# Patient Record
Sex: Female | Born: 1950 | Race: White | Hispanic: No | Smoking: Current every day smoker
Health system: Southern US, Community
[De-identification: ages and names within clinical notes are randomized; demographics above are authoritative.]

## PROBLEM LIST (undated history)

## (undated) DIAGNOSIS — E7800 Pure hypercholesterolemia, unspecified: Secondary | ICD-10-CM

## (undated) DIAGNOSIS — C439 Malignant melanoma of skin, unspecified: Secondary | ICD-10-CM

## (undated) DIAGNOSIS — I10 Essential (primary) hypertension: Secondary | ICD-10-CM

## (undated) DIAGNOSIS — E119 Type 2 diabetes mellitus without complications: Secondary | ICD-10-CM

## (undated) HISTORY — PX: APPENDECTOMY: SHX54

## (undated) HISTORY — PX: SKIN SURGERY: SHX2413

## (undated) HISTORY — PX: BREAST EXCISIONAL BIOPSY: SUR124

## (undated) HISTORY — PX: TUBAL LIGATION: SHX77

## (undated) HISTORY — DX: Essential (primary) hypertension: I10

## (undated) HISTORY — DX: Malignant melanoma of skin, unspecified: C43.9

## (undated) HISTORY — PX: BREAST CYST EXCISION: SHX579

---

## 2003-04-25 DIAGNOSIS — C439 Malignant melanoma of skin, unspecified: Secondary | ICD-10-CM

## 2003-04-25 HISTORY — DX: Malignant melanoma of skin, unspecified: C43.9

## 2003-10-08 ENCOUNTER — Other Ambulatory Visit: Payer: Self-pay

## 2003-10-10 ENCOUNTER — Other Ambulatory Visit: Payer: Self-pay

## 2005-05-23 ENCOUNTER — Ambulatory Visit: Payer: Self-pay

## 2005-06-02 ENCOUNTER — Ambulatory Visit: Payer: Self-pay

## 2005-06-08 ENCOUNTER — Ambulatory Visit: Payer: Self-pay | Admitting: Family Medicine

## 2005-12-15 ENCOUNTER — Ambulatory Visit: Payer: Self-pay

## 2006-08-16 ENCOUNTER — Ambulatory Visit: Payer: Self-pay

## 2006-08-23 ENCOUNTER — Ambulatory Visit: Payer: Self-pay

## 2009-01-28 ENCOUNTER — Emergency Department: Payer: Self-pay | Admitting: Emergency Medicine

## 2009-09-21 ENCOUNTER — Ambulatory Visit: Payer: Self-pay | Admitting: Family Medicine

## 2009-09-23 ENCOUNTER — Ambulatory Visit: Payer: Self-pay | Admitting: Family Medicine

## 2009-09-28 ENCOUNTER — Ambulatory Visit: Payer: Self-pay

## 2009-11-27 ENCOUNTER — Emergency Department: Payer: Self-pay | Admitting: Emergency Medicine

## 2010-03-09 ENCOUNTER — Ambulatory Visit: Payer: Self-pay

## 2010-04-12 LAB — PATHOLOGY REPORT

## 2010-06-07 ENCOUNTER — Emergency Department: Payer: Self-pay | Admitting: Emergency Medicine

## 2010-09-06 ENCOUNTER — Ambulatory Visit: Payer: Self-pay

## 2010-09-21 ENCOUNTER — Ambulatory Visit: Payer: Self-pay

## 2011-04-08 ENCOUNTER — Emergency Department: Payer: Self-pay | Admitting: Unknown Physician Specialty

## 2012-01-24 ENCOUNTER — Ambulatory Visit: Payer: Self-pay | Admitting: Family Medicine

## 2013-02-13 ENCOUNTER — Ambulatory Visit (INDEPENDENT_AMBULATORY_CARE_PROVIDER_SITE_OTHER): Payer: BC Managed Care – PPO | Admitting: Cardiovascular Disease

## 2013-02-13 ENCOUNTER — Encounter: Payer: Self-pay | Admitting: Cardiovascular Disease

## 2013-02-13 VITALS — BP 139/92 | HR 72 | Ht 64.0 in | Wt 185.8 lb

## 2013-02-13 DIAGNOSIS — E785 Hyperlipidemia, unspecified: Secondary | ICD-10-CM

## 2013-02-13 DIAGNOSIS — R519 Headache, unspecified: Secondary | ICD-10-CM

## 2013-02-13 DIAGNOSIS — R51 Headache: Secondary | ICD-10-CM

## 2013-02-13 DIAGNOSIS — R079 Chest pain, unspecified: Secondary | ICD-10-CM

## 2013-02-13 DIAGNOSIS — R0602 Shortness of breath: Secondary | ICD-10-CM

## 2013-02-13 DIAGNOSIS — I10 Essential (primary) hypertension: Secondary | ICD-10-CM

## 2013-02-13 DIAGNOSIS — R9431 Abnormal electrocardiogram [ECG] [EKG]: Secondary | ICD-10-CM

## 2013-02-13 NOTE — Assessment & Plan Note (Signed)
She is very concerned about her blood pressure. Repeat blood pressure by myself was 140/90. We have suggested she closely monitor her blood pressure at home over the next several weeks. If this continues to run high, she could take 1-1/2 of her current medication, possibly even doubling the dose. We have asked her her high salt meals. Recommended regular exercise and weight loss. Other medication options include amlodipine

## 2013-02-13 NOTE — Assessment & Plan Note (Signed)
She is requested to have her lab work done today. We will forward this to her primary care physician. Given her very strong family history, we have recommended continued smoking cessation, aggressive cholesterol control.

## 2013-02-13 NOTE — Progress Notes (Signed)
   Patient ID: Dawn Shepherd, female    DOB: Feb 12, 1951, 62 y.o.   MRN: [id number]  HPI Comments: Dawn Shepherd is a pleasant 62 year old woman with hypertension, obesity presents for abnormal EKG. He is a patient of Dr.Pancaldo.  She reports a long smoking history from age 7 up to age 62, one pack per day. Both of her parents died of MI in their early 61s.  Her biggest complaint today is waxing waning headaches. She felt her headache was secondary to running out of her blood pressure pills. Despite being back on her medication, she is continued to have mild headache. She does report some stress at work, uses the computer. Shoulders and muscles in her neck feel tight.  With activity she denies any significant shortness of breath or chest pain. She is otherwise very active with no complaints. Is concerned about abnormal EKG.  EKG done on 02/12/2013 with Dr. Lahoma Rocker shows normal sinus rhythm with rate 77 beats per minute with nonspecific T wave abnormality in leads V1 through V4 EKG today shows normal sinus rhythm with rate 72 beats a minute with no significant ST or T-wave abnormality. (Anterior T-wave abnormality has resolved)   Outpatient Encounter Prescriptions as of 02/13/2013  Medication Sig Dispense Refill  . benazepril-hydrochlorthiazide (LOTENSIN HCT) 10-12.5 MG per tablet Take 1 tablet by mouth daily.      . vitamin E 400 UNIT capsule Take 400 Units by mouth daily.       No facility-administered encounter medications on file as of 02/13/2013.     Review of Systems  Constitutional: Negative.   Eyes: Negative.   Respiratory: Negative.   Cardiovascular: Negative.   Gastrointestinal: Negative.   Endocrine: Negative.   Musculoskeletal: Negative.   Skin: Negative.   Allergic/Immunologic: Negative.   Neurological: Positive for headaches.  Hematological: Negative.   Psychiatric/Behavioral: Negative.   All other systems reviewed and are negative.    BP 139/92  Pulse 72  Ht 5\' 4"   (1.626 m)  Wt 185 lb 12 oz (84.256 kg)  BMI 31.87 kg/m2  Physical Exam  Nursing note and vitals reviewed. Constitutional: She is oriented to person, place, and time. She appears well-developed and well-nourished.  HENT:  Head: Normocephalic.  Nose: Nose normal.  Mouth/Throat: Oropharynx is clear and moist.  Eyes: Conjunctivae are normal. Pupils are equal, round, and reactive to light.  Neck: Normal range of motion. Neck supple. No JVD present.  Cardiovascular: Normal rate, regular rhythm, S1 normal, S2 normal, normal heart sounds and intact distal pulses.  Exam reveals no gallop and no friction rub.   No murmur heard. Pulmonary/Chest: Effort normal and breath sounds normal. No respiratory distress. She has no wheezes. She has no rales. She exhibits no tenderness.  Abdominal: Soft. Bowel sounds are normal. She exhibits no distension. There is no tenderness.  Musculoskeletal: Normal range of motion. She exhibits no edema and no tenderness.  Lymphadenopathy:    She has no cervical adenopathy.  Neurological: She is alert and oriented to person, place, and time. Coordination normal.  Skin: Skin is warm and dry. No rash noted. No erythema.  Psychiatric: She has a normal mood and affect. Her behavior is normal. Judgment and thought content normal.    Assessment and Plan

## 2013-02-13 NOTE — Assessment & Plan Note (Signed)
I suspect she may be having stress-related headaches. Unable to exclude some DJD in her neck. Recommended a cervical pillow. She is taking Aleve as needed.

## 2013-02-13 NOTE — Patient Instructions (Addendum)
You are doing well. No medication changes were made.  We will check you blood today Please monitor you blood pressure at home If your blood pressures runs high, call the office You could take 1 1/2  up to 2 blood pressure pills  Please call us if you have new issues that need to be addressed before your next appt.

## 2013-02-13 NOTE — Assessment & Plan Note (Signed)
She denies any significant symptoms of shortness of breath or chest pain. She does have headaches which are likely unrelated to a cardiac condition. Repeat EKG today shows normalization of her T waves. This could be secondary to lead placement. Given her good activity level with no symptoms, no further testing has been ordered at this time.

## 2013-02-14 LAB — LIPID PANEL
Chol/HDL Ratio: 9.2 ratio units — ABNORMAL HIGH (ref 0.0–4.4)
HDL: 34 mg/dL — ABNORMAL LOW (ref >39)
LDL Calculated: 209 mg/dL — ABNORMAL HIGH (ref 0–99)
Triglycerides: 357 mg/dL — ABNORMAL HIGH (ref 0–149)

## 2013-02-14 LAB — BASIC METABOLIC PANEL
BUN/Creatinine Ratio: 13 (ref 11–26)
BUN: 10 mg/dL (ref 8–27)
CO2: 25 mmol/L (ref 18–29)
Calcium: 10.3 mg/dL — ABNORMAL HIGH (ref 8.6–10.2)
Creatinine, Ser: 0.79 mg/dL (ref 0.57–1.00)
Glucose: 291 mg/dL — ABNORMAL HIGH (ref 65–99)
Sodium: 137 mmol/L (ref 134–144)

## 2013-02-27 ENCOUNTER — Telehealth: Payer: Self-pay

## 2013-02-27 DIAGNOSIS — E7800 Pure hypercholesterolemia, unspecified: Secondary | ICD-10-CM

## 2013-02-27 NOTE — Telephone Encounter (Signed)
Spoke w/ pt.  She would like to try lovastatin, if possible, b/c it is on the $4 list at [facility]. Please advise.  Thank you!

## 2013-02-27 NOTE — Telephone Encounter (Signed)
Message copied by Marilynne Halsted on Thu Feb 27, 2013  9:19 AM ------      Message from: Antonieta Iba      Created: Wed Feb 26, 2013 11:00 PM       Cholesterol is very elevated.      Would start a cholesterol medication such as atorvastatin 20 mg pill      Cut in 1/2 for the first few weeks, then full pill ------

## 2013-02-28 NOTE — Telephone Encounter (Signed)
This pill is weaker and need a higher dose Very possible that it may not work well enough that we can try Would order 40 mg pill, cutting half for the first few weeks then increase to a full pill, Repeat lipid panel in 3 months

## 2013-03-03 MED ORDER — LOVASTATIN 40 MG PO TABS: 40.0000 mg | ORAL_TABLET | Freq: Every day | ORAL | Status: DC

## 2013-03-03 NOTE — Telephone Encounter (Signed)
Spoke w/ pt.  She verbalizes understanding and is agreeable to plan. 

## 2013-03-28 ENCOUNTER — Ambulatory Visit: Payer: Self-pay | Admitting: Family Medicine

## 2013-04-11 ENCOUNTER — Ambulatory Visit: Payer: Self-pay | Admitting: Family Medicine

## 2013-04-27 ENCOUNTER — Emergency Department: Payer: Self-pay | Admitting: Emergency Medicine

## 2013-04-27 LAB — URINALYSIS, COMPLETE
BILIRUBIN, UR: NEGATIVE
Bacteria: NONE SEEN
Glucose,UR: 50 mg/dL (ref 0–75)
Hyaline Cast: 3
Leukocyte Esterase: NEGATIVE
NITRITE: NEGATIVE
PH: 5 (ref 4.5–8.0)
Protein: NEGATIVE
Specific Gravity: 1.01 (ref 1.003–1.030)
Squamous Epithelial: 1

## 2013-04-27 LAB — COMPREHENSIVE METABOLIC PANEL
ALK PHOS: 120 U/L — AB
ALT: 59 U/L (ref 12–78)
AST: 22 U/L (ref 15–37)
Albumin: 3.4 g/dL (ref 3.4–5.0)
Anion Gap: 7 (ref 7–16)
BUN: 14 mg/dL (ref 7–18)
Bilirubin,Total: 0.3 mg/dL (ref 0.2–1.0)
CO2: 25 mmol/L (ref 21–32)
Calcium, Total: 8.9 mg/dL (ref 8.5–10.1)
Chloride: 100 mmol/L (ref 98–107)
Creatinine: 0.91 mg/dL (ref 0.60–1.30)
EGFR (African American): >60
EGFR (Non-African Amer.): >60
Glucose: 236 mg/dL — ABNORMAL HIGH (ref 65–99)
Osmolality: 273 (ref 275–301)
Potassium: 3.3 mmol/L — ABNORMAL LOW (ref 3.5–5.1)
Sodium: 132 mmol/L — ABNORMAL LOW (ref 136–145)
Total Protein: 7.2 g/dL (ref 6.4–8.2)

## 2013-04-27 LAB — CBC
HCT: 43.5 % (ref 35.0–47.0)
HGB: 15.2 g/dL (ref 12.0–16.0)
MCH: 29.9 pg (ref 26.0–34.0)
MCHC: 34.9 g/dL (ref 32.0–36.0)
MCV: 86 fL (ref 80–100)
Platelet: 249 10*3/uL (ref 150–440)
RBC: 5.07 10*6/uL (ref 3.80–5.20)
RDW: 12.3 % (ref 11.5–14.5)
WBC: 7.3 10*3/uL (ref 3.6–11.0)

## 2013-04-27 LAB — LIPASE, BLOOD: Lipase: 178 U/L (ref 73–393)

## 2013-08-11 ENCOUNTER — Ambulatory Visit: Payer: Self-pay | Admitting: General Surgery

## 2013-08-21 ENCOUNTER — Encounter: Payer: Self-pay | Admitting: *Deleted

## 2013-10-21 ENCOUNTER — Ambulatory Visit: Payer: Self-pay | Admitting: Family Medicine

## 2014-07-12 ENCOUNTER — Emergency Department: Payer: Self-pay | Admitting: Internal Medicine

## 2014-09-03 ENCOUNTER — Ambulatory Visit: Payer: 59 | Admitting: *Deleted

## 2015-02-19 ENCOUNTER — Other Ambulatory Visit: Payer: Self-pay | Admitting: Family Medicine

## 2015-02-19 DIAGNOSIS — Z1231 Encounter for screening mammogram for malignant neoplasm of breast: Secondary | ICD-10-CM

## 2015-02-22 ENCOUNTER — Other Ambulatory Visit: Payer: Self-pay | Admitting: Family Medicine

## 2015-02-22 DIAGNOSIS — Z1239 Encounter for other screening for malignant neoplasm of breast: Secondary | ICD-10-CM

## 2015-12-16 ENCOUNTER — Other Ambulatory Visit: Payer: Self-pay | Admitting: Family Medicine

## 2015-12-16 DIAGNOSIS — N6320 Unspecified lump in the left breast, unspecified quadrant: Secondary | ICD-10-CM

## 2016-01-07 ENCOUNTER — Ambulatory Visit: Payer: Self-pay | Attending: Family Medicine

## 2016-01-07 ENCOUNTER — Other Ambulatory Visit: Payer: Self-pay

## 2016-01-07 ENCOUNTER — Ambulatory Visit: Admission: RE | Admit: 2016-01-07 | Payer: Self-pay | Source: Ambulatory Visit

## 2017-03-12 ENCOUNTER — Other Ambulatory Visit: Payer: Self-pay | Admitting: Family Medicine

## 2017-03-12 DIAGNOSIS — N6320 Unspecified lump in the left breast, unspecified quadrant: Secondary | ICD-10-CM

## 2017-04-26 ENCOUNTER — Encounter: Payer: Self-pay | Admitting: Radiology

## 2017-04-26 ENCOUNTER — Ambulatory Visit
Admission: RE | Admit: 2017-04-26 | Discharge: 2017-04-26 | Disposition: A | Discharge status: Home / Self Care | Payer: Medicare HMO | Source: Ambulatory Visit | Attending: Family Medicine | Admitting: Family Medicine

## 2017-04-26 DIAGNOSIS — N6323 Unspecified lump in the left breast, lower outer quadrant: Secondary | ICD-10-CM | POA: Diagnosis not present

## 2017-04-26 DIAGNOSIS — N6320 Unspecified lump in the left breast, unspecified quadrant: Secondary | ICD-10-CM

## 2017-11-08 ENCOUNTER — Emergency Department (HOSPITAL_COMMUNITY)
Admission: EM | Admit: 2017-11-08 | Discharge: 2017-11-08 | Disposition: A | Discharge status: Home / Self Care | Payer: Medicare HMO | Attending: Emergency Medicine | Admitting: Emergency Medicine

## 2017-11-08 ENCOUNTER — Other Ambulatory Visit: Payer: Self-pay

## 2017-11-08 ENCOUNTER — Encounter (HOSPITAL_COMMUNITY): Payer: Self-pay | Admitting: Emergency Medicine

## 2017-11-08 ENCOUNTER — Emergency Department (HOSPITAL_COMMUNITY): Payer: Medicare HMO

## 2017-11-08 DIAGNOSIS — I10 Essential (primary) hypertension: Secondary | ICD-10-CM | POA: Diagnosis not present

## 2017-11-08 DIAGNOSIS — R5383 Other fatigue: Secondary | ICD-10-CM | POA: Insufficient documentation

## 2017-11-08 DIAGNOSIS — Z87891 Personal history of nicotine dependence: Secondary | ICD-10-CM | POA: Insufficient documentation

## 2017-11-08 DIAGNOSIS — R110 Nausea: Secondary | ICD-10-CM | POA: Diagnosis not present

## 2017-11-08 DIAGNOSIS — Z7982 Long term (current) use of aspirin: Secondary | ICD-10-CM | POA: Insufficient documentation

## 2017-11-08 DIAGNOSIS — Z85828 Personal history of other malignant neoplasm of skin: Secondary | ICD-10-CM | POA: Insufficient documentation

## 2017-11-08 DIAGNOSIS — Z7984 Long term (current) use of oral hypoglycemic drugs: Secondary | ICD-10-CM | POA: Insufficient documentation

## 2017-11-08 DIAGNOSIS — R072 Precordial pain: Secondary | ICD-10-CM

## 2017-11-08 DIAGNOSIS — Z79899 Other long term (current) drug therapy: Secondary | ICD-10-CM | POA: Diagnosis not present

## 2017-11-08 LAB — CBC
HCT: 43.7 % (ref 36.0–46.0)
Hemoglobin: 14.4 g/dL (ref 12.0–15.0)
MCH: 29.4 pg (ref 26.0–34.0)
MCHC: 33 g/dL (ref 30.0–36.0)
MCV: 89.4 fL (ref 78.0–100.0)
PLATELETS: 307 10*3/uL (ref 150–400)
RBC: 4.89 MIL/uL (ref 3.87–5.11)
RDW: 12.3 % (ref 11.5–15.5)
WBC: 7.4 10*3/uL (ref 4.0–10.5)

## 2017-11-08 LAB — BASIC METABOLIC PANEL
Anion gap: 12 (ref 5–15)
BUN: 13 mg/dL (ref 8–23)
CHLORIDE: 104 mmol/L (ref 98–111)
CO2: 22 mmol/L (ref 22–32)
CREATININE: 0.87 mg/dL (ref 0.44–1.00)
Calcium: 9.8 mg/dL (ref 8.9–10.3)
GFR calc non Af Amer: >60 mL/min (ref >60)
Glucose, Bld: 162 mg/dL — ABNORMAL HIGH (ref 70–99)
Potassium: 4.4 mmol/L (ref 3.5–5.1)
Sodium: 138 mmol/L (ref 135–145)

## 2017-11-08 LAB — I-STAT TROPONIN, ED
TROPONIN I, POC: 0 ng/mL (ref 0.00–0.08)
TROPONIN I, POC: 0 ng/mL (ref 0.00–0.08)

## 2017-11-08 LAB — CBG MONITORING, ED: Glucose-Capillary: 155 mg/dL — ABNORMAL HIGH (ref 70–99)

## 2017-11-08 NOTE — ED Triage Notes (Addendum)
Patient complains of left sided chest pressure and weakness that started last night. History of diabetes. Alert and oriented and in no apparent distress at this time

## 2017-11-08 NOTE — ED Provider Notes (Signed)
[facility] EMERGENCY DEPARTMENT Provider Note   CSN: [id number] Arrival date & time: 11/08/17  1149     History   Chief Complaint Chief Complaint  Patient presents with  . Chest Pain    HPI Dawn Shepherd is a 67 y.o. female.  Patient with history of hypertension, diabetes, high cholesterol, family history of coronary artery disease --presents to the emergency department today with acute onset of left-sided chest pressure starting about 7:30 AM today.  Symptoms began while patient was at rest.  Pain did not radiate.  She had no associated vomiting or diaphoresis.  She has not had any recent similar symptoms or recent exertional symptoms.  She denies fever or cough.  No domino pain, diarrhea or urinary symptoms.  The onset of this condition was acute. The course is resolved. Aggravating factors: none. Alleviating factors: none.       Past Medical History:  Diagnosis Date  . Hypertension   . Skin cancer (melanoma) ([place]) 2005    Patient Active Problem List   Diagnosis Date Noted  . Abnormal EKG 02/13/2013  . Hyperlipidemia 02/13/2013  . Essential hypertension 02/13/2013  . Frequent headaches 02/13/2013    Past Surgical History:  Procedure Laterality Date  . SKIN SURGERY     right wrist skin cancer.   . TUBAL LIGATION       OB History   None      Home Medications    Prior to Admission medications   Medication Sig Start Date End Date Taking? Authorizing Provider  aspirin EC 81 MG tablet Take 81 mg by mouth daily.   Yes [provider]  atorvastatin (LIPITOR) 80 MG tablet Take 80 mg by mouth daily. 10/19/17  Yes [provider]  lisinopril-hydrochlorothiazide (PRINZIDE,ZESTORETIC) 20-25 MG tablet Take 1 tablet by mouth daily. 10/22/17  Yes [provider]  metFORMIN (GLUCOPHAGE) 500 MG tablet Take 1,000 mg by mouth daily.   Yes [provider]  lovastatin (MEVACOR) 40 MG tablet Take 1 tablet (40 mg total) by  mouth at bedtime. Patient not taking: Reported on 11/08/2017 03/03/13   Minna Merritts, MD    Family History Family History  Problem Relation Age of Onset  . Heart attack Mother 6  . Heart attack Father 49  . Breast cancer Neg Hx     Social History Social History   Tobacco Use  . Smoking status: Former Smoker    Packs/day: 1.00    Years: 20.00    Pack years: 20.00    Types: Cigarettes  Substance Use Topics  . Alcohol use: No  . Drug use: No     Allergies   Patient has no known allergies.   Review of Systems Review of Systems  Constitutional: Negative for diaphoresis and fever.  Eyes: Negative for redness.  Respiratory: Negative for cough and shortness of breath.   Cardiovascular: Positive for chest pain. Negative for palpitations and leg swelling.  Gastrointestinal: Negative for abdominal pain, nausea and vomiting.  Genitourinary: Negative for dysuria.  Musculoskeletal: Negative for back pain and neck pain.  Skin: Negative for rash.  Neurological: Negative for syncope and light-headedness.  Psychiatric/Behavioral: The patient is not nervous/anxious.      Physical Exam Updated Vital Signs BP (!) 147/98   Pulse 77   Temp 97.7 F (36.5 C) (Oral)   Resp 16   SpO2 96%   Physical Exam  Constitutional: She appears well-developed and well-nourished.  HENT:  Head: Normocephalic and atraumatic.  Mouth/Throat: Mucous membranes are normal. Mucous membranes are not dry.  Eyes: Conjunctivae are normal.  Neck: Trachea normal and normal range of motion. Neck supple. Normal carotid pulses and no JVD present. No muscular tenderness present. Carotid bruit is not present. No tracheal deviation present.  Cardiovascular: Normal rate, regular rhythm, S1 normal, S2 normal, normal heart sounds and intact distal pulses. Exam reveals no decreased pulses.  No murmur heard. Pulmonary/Chest: Effort normal. No respiratory distress. She has no wheezes. She exhibits no tenderness.    Abdominal: Soft. Normal aorta and bowel sounds are normal. There is no tenderness. There is no rebound and no guarding.  Musculoskeletal: Normal range of motion.  Neurological: She is alert.  Skin: Skin is warm and dry. She is not diaphoretic. No cyanosis. No pallor.  Psychiatric: She has a normal mood and affect.  Nursing note and vitals reviewed.    ED Treatments / Results  Labs (all labs ordered are listed, but only abnormal results are displayed) Labs Reviewed  BASIC METABOLIC PANEL - Abnormal; Notable for the following components:      Result Value   Glucose, Bld 162 (*)    All other components within normal limits  CBG MONITORING, ED - Abnormal; Notable for the following components:   Glucose-Capillary 155 (*)    All other components within normal limits  CBC  I-STAT TROPONIN, ED  I-STAT TROPONIN, ED    EKG EKG Interpretation  Date/Time:  Thursday November 08 2017 16:17:44 EDT Ventricular Rate:  71 PR Interval:    QRS Duration: 110 QT Interval:  406 QTC Calculation: 442 R Axis:   60 Text Interpretation:  Sinus rhythm Borderline low voltage, extremity leads no acute ischemic changes.  similar to previous. Confirmed by Charlesetta Shanks [phone]) on 11/08/2017 4:46:58 PM   Radiology Dg Chest 2 View  Result Date: 11/08/2017 CLINICAL DATA:  Left-sided chest pain.  Weakness. EXAM: CHEST - 2 VIEW COMPARISON:  Chest x-ray dated July 12, 2014. FINDINGS: The heart remains at the upper limits of normal in size. Normal pulmonary vascularity. Atherosclerotic calcification of the aortic arch. Minimal atelectasis in the medial right lung base. No focal consolidation, pleural effusion, or pneumothorax. No acute osseous abnormality. IMPRESSION: No active cardiopulmonary disease. Electronically Signed   By: Titus Dubin M.D.   On: 11/08/2017 12:49    Procedures Procedures (including critical care time)  Medications Ordered in ED Medications - No data to display   Initial  Impression / Assessment and Plan / ED Course  I have reviewed the triage vital signs and the nursing notes.  Pertinent labs & imaging results that were available during my care of the patient were reviewed by me and considered in my medical decision making (see chart for details).     Patient seen and examined.  Initial work-up reassuring.  EKG reviewed.  Will check repeat troponin and EKG.  Discussed with Dr. Johnney Killian.  Vital signs reviewed and are as follows: BP (!) 147/98   Pulse 77   Temp 97.7 F (36.5 C) (Oral)   Resp 16   SpO2 96%   Patient discussed with and seen by Dr. Johnney Killian.   Repeat troponin negative, EKG unchanged.  Patient remains symptom-free.  6:20 PM I spoke with on-call cardiology and ask them to facilitate patient follow-up and possible provocative testing.  They state that they will make arrangements.   Patient updated.  She remains pain-free.  Patient was counseled to return with severe chest pain, especially if the pain is  crushing or pressure-like and spreads to the arms, back, neck, or jaw, or if they have sweating, nausea, or shortness of breath with the pain. They were encouraged to call 911 with these symptoms.   They were also told to return if their chest pain gets worse and does not go away with rest, they have an attack of chest pain lasting longer than usual despite rest and treatment with the medications their caregiver has prescribed, if they wake from sleep with chest pain or shortness of breath, if they feel dizzy or faint, if they have chest pain not typical of their usual pain, or if they have any other emergent concerns regarding their health.  The patient verbalized understanding and agreed.    Final Clinical Impressions(s) / ED Diagnoses   Final diagnoses:  Precordial pain   Patient with left-sided chest pain earlier today with risk factors.  She has not had provocative testing in the past.  Troponin here negative x2.  EKG nonischemic.   Chest x-ray is clear.  Remainder of lab work-up is reassuring.  Given risk factors and history, arrangements made with cardiology to facilitate follow-up.  Patient given return instructions as above and seems reliable to return with recurrent or worsening symptoms.  ED Discharge Orders    None       Carlisle Cater, Hershal Coria 11/08/17 Sharen Heck, MD 11/24/17 [phone]

## 2017-11-08 NOTE — Discharge Instructions (Signed)
Please read and follow all provided instructions.  Your diagnoses today include:  1. Precordial pain     Tests performed today include:  An EKG of your heart  A chest x-ray  Cardiac enzymes - a blood test for heart muscle damage  Blood counts and electrolytes  Vital signs. See below for your results today.   Medications prescribed:   None  Take any prescribed medications only as directed.  Follow-up instructions: Please follow-up with your primary care provider as soon as you can for further evaluation of your symptoms.   Return instructions:  SEEK IMMEDIATE MEDICAL ATTENTION IF:  You have severe chest pain, especially if the pain is crushing or pressure-like and spreads to the arms, back, neck, or jaw, or if you have sweating, nausea (feeling sick to your stomach), or shortness of breath. THIS IS AN EMERGENCY. Don't wait to see if the pain will go away. Get medical help at once. Call 911 or 0 (operator). DO NOT drive yourself to the hospital.   Your chest pain gets worse and does not go away with rest.   You have an attack of chest pain lasting longer than usual, despite rest and treatment with the medications your caregiver has prescribed.   You wake from sleep with chest pain or shortness of breath.  You feel dizzy or faint.  You have chest pain not typical of your usual pain for which you originally saw your caregiver.   You have any other emergent concerns regarding your health.  Additional Information: Chest pain comes from many different causes. Your caregiver has diagnosed you as having chest pain that is not specific for one problem, but does not require admission.  You are at low risk for an acute heart condition or other serious illness.   Your vital signs today were: BP 112/71    Pulse 65    Temp 97.7 F (36.5 C) (Oral)    Resp (!) 21    SpO2 98%  If your blood pressure (BP) was elevated above 135/85 this visit, please have this repeated by your doctor  within one month. --------------

## 2017-11-08 NOTE — ED Provider Notes (Signed)
Medical screening examination/treatment/procedure(s) were conducted as a shared visit with non-physician practitioner(s) and myself.  I personally evaluated the patient during the encounter.  EKG Interpretation  Date/Time:  Thursday November 08 2017 16:17:44 EDT Ventricular Rate:  71 PR Interval:    QRS Duration: 110 QT Interval:  406 QTC Calculation: 442 R Axis:   60 Text Interpretation:  Sinus rhythm Borderline low voltage, extremity leads no acute ischemic changes.  similar to previous. Confirmed by Charlesetta Shanks [phone]) on 11/08/2017 4:46:58 PM  She has a left-sided continues heavy pressure that started while at rest.  Patient felt very weak earlier today.  She felt extremely fatigued.  Some associated nausea.  Patient denies history of similar episodes.  She takes a daily aspirin.  Struve diabetes and hypertension but no known coronary artery disease.  Patient is alert and nontoxic.  No respiratory distress.  Regular no rub or gallop.  Lungs clear without wheeze rhonchi or rale.  Some reproduction of discomfort with pressure to the anterior chest.  Extremities no swelling or calf tenderness.  I agree with plan of management.   Charlesetta Shanks, MD 11/08/17 [phone]

## 2018-01-04 ENCOUNTER — Ambulatory Visit: Payer: Medicare HMO | Admitting: Cardiovascular Disease

## 2018-02-25 ENCOUNTER — Other Ambulatory Visit: Payer: Self-pay | Admitting: Family Medicine

## 2018-04-18 ENCOUNTER — Other Ambulatory Visit: Payer: Self-pay | Admitting: Family Medicine

## 2018-04-18 DIAGNOSIS — Z1231 Encounter for screening mammogram for malignant neoplasm of breast: Secondary | ICD-10-CM

## 2018-05-09 ENCOUNTER — Ambulatory Visit
Admission: RE | Admit: 2018-05-09 | Discharge: 2018-05-09 | Disposition: A | Discharge status: Home / Self Care | Payer: Medicare HMO | Source: Ambulatory Visit | Attending: Family Medicine | Admitting: Family Medicine

## 2018-05-09 DIAGNOSIS — Z1231 Encounter for screening mammogram for malignant neoplasm of breast: Secondary | ICD-10-CM | POA: Diagnosis present

## 2019-04-10 ENCOUNTER — Encounter: Payer: Self-pay | Admitting: Emergency Medicine

## 2019-04-10 ENCOUNTER — Inpatient Hospital Stay
Admission: EM | Admit: 2019-04-10 | Discharge: 2019-04-14 | DRG: 440 | Disposition: A | Discharge status: Home / Self Care | Payer: Medicare HMO | Attending: Internal Medicine | Admitting: Internal Medicine

## 2019-04-10 ENCOUNTER — Emergency Department: Payer: Medicare HMO

## 2019-04-10 ENCOUNTER — Other Ambulatory Visit: Payer: Self-pay

## 2019-04-10 DIAGNOSIS — C439 Malignant melanoma of skin, unspecified: Secondary | ICD-10-CM

## 2019-04-10 DIAGNOSIS — Z79899 Other long term (current) drug therapy: Secondary | ICD-10-CM

## 2019-04-10 DIAGNOSIS — Z20828 Contact with and (suspected) exposure to other viral communicable diseases: Secondary | ICD-10-CM | POA: Diagnosis present

## 2019-04-10 DIAGNOSIS — Z8249 Family history of ischemic heart disease and other diseases of the circulatory system: Secondary | ICD-10-CM

## 2019-04-10 DIAGNOSIS — I10 Essential (primary) hypertension: Secondary | ICD-10-CM | POA: Diagnosis present

## 2019-04-10 DIAGNOSIS — R112 Nausea with vomiting, unspecified: Secondary | ICD-10-CM | POA: Diagnosis not present

## 2019-04-10 DIAGNOSIS — Z7984 Long term (current) use of oral hypoglycemic drugs: Secondary | ICD-10-CM

## 2019-04-10 DIAGNOSIS — K8020 Calculus of gallbladder without cholecystitis without obstruction: Secondary | ICD-10-CM | POA: Diagnosis present

## 2019-04-10 DIAGNOSIS — R1110 Vomiting, unspecified: Secondary | ICD-10-CM

## 2019-04-10 DIAGNOSIS — Z85820 Personal history of malignant melanoma of skin: Secondary | ICD-10-CM

## 2019-04-10 DIAGNOSIS — K859 Acute pancreatitis without necrosis or infection, unspecified: Secondary | ICD-10-CM | POA: Diagnosis not present

## 2019-04-10 DIAGNOSIS — Z9049 Acquired absence of other specified parts of digestive tract: Secondary | ICD-10-CM

## 2019-04-10 DIAGNOSIS — Z87891 Personal history of nicotine dependence: Secondary | ICD-10-CM

## 2019-04-10 DIAGNOSIS — E119 Type 2 diabetes mellitus without complications: Secondary | ICD-10-CM | POA: Diagnosis present

## 2019-04-10 DIAGNOSIS — Z7982 Long term (current) use of aspirin: Secondary | ICD-10-CM

## 2019-04-10 DIAGNOSIS — I700 Atherosclerosis of aorta: Secondary | ICD-10-CM | POA: Diagnosis present

## 2019-04-10 DIAGNOSIS — E785 Hyperlipidemia, unspecified: Secondary | ICD-10-CM | POA: Diagnosis present

## 2019-04-10 LAB — CBC
HCT: 39.2 % (ref 36.0–46.0)
Hemoglobin: 13.1 g/dL (ref 12.0–15.0)
MCH: 28.4 pg (ref 26.0–34.0)
MCHC: 33.4 g/dL (ref 30.0–36.0)
MCV: 85 fL (ref 80.0–100.0)
Platelets: 367 10*3/uL (ref 150–400)
RBC: 4.61 MIL/uL (ref 3.87–5.11)
RDW: 12.5 % (ref 11.5–15.5)
WBC: 10.2 10*3/uL (ref 4.0–10.5)
nRBC: 0 % (ref 0.0–0.2)

## 2019-04-10 LAB — URINALYSIS, COMPLETE (UACMP) WITH MICROSCOPIC
Bacteria, UA: NONE SEEN
Bilirubin Urine: NEGATIVE
Glucose, UA: 50 mg/dL — AB
Hgb urine dipstick: NEGATIVE
Ketones, ur: NEGATIVE mg/dL
Nitrite: NEGATIVE
Protein, ur: 100 mg/dL — AB
Specific Gravity, Urine: 1.023 (ref 1.005–1.030)
pH: 5 (ref 5.0–8.0)

## 2019-04-10 LAB — COMPREHENSIVE METABOLIC PANEL
ALT: 17 U/L (ref 0–44)
AST: 18 U/L (ref 15–41)
Albumin: 4 g/dL (ref 3.5–5.0)
Alkaline Phosphatase: 142 U/L — ABNORMAL HIGH (ref 38–126)
Anion gap: 12 (ref 5–15)
BUN: 17 mg/dL (ref 8–23)
CO2: 24 mmol/L (ref 22–32)
Calcium: 10.5 mg/dL — ABNORMAL HIGH (ref 8.9–10.3)
Chloride: 95 mmol/L — ABNORMAL LOW (ref 98–111)
Creatinine, Ser: 0.98 mg/dL (ref 0.44–1.00)
GFR calc Af Amer: >60 mL/min (ref >60)
GFR calc non Af Amer: 60 mL/min — ABNORMAL LOW (ref >60)
Glucose, Bld: 259 mg/dL — ABNORMAL HIGH (ref 70–99)
Potassium: 4.2 mmol/L (ref 3.5–5.1)
Sodium: 131 mmol/L — ABNORMAL LOW (ref 135–145)
Total Bilirubin: 0.6 mg/dL (ref 0.3–1.2)
Total Protein: 7.9 g/dL (ref 6.5–8.1)

## 2019-04-10 LAB — HIV ANTIBODY (ROUTINE TESTING W REFLEX): HIV Screen 4th Generation wRfx: NONREACTIVE

## 2019-04-10 LAB — TRIGLYCERIDES: Triglycerides: 188 mg/dL — ABNORMAL HIGH (ref <150)

## 2019-04-10 LAB — HEMOGLOBIN A1C
Hgb A1c MFr Bld: 8.5 % — ABNORMAL HIGH (ref 4.8–5.6)
Mean Plasma Glucose: 197.25 mg/dL

## 2019-04-10 LAB — GLUCOSE, CAPILLARY
Glucose-Capillary: 126 mg/dL — ABNORMAL HIGH (ref 70–99)
Glucose-Capillary: 137 mg/dL — ABNORMAL HIGH (ref 70–99)

## 2019-04-10 LAB — LIPASE, BLOOD: Lipase: 282 U/L — ABNORMAL HIGH (ref 11–51)

## 2019-04-10 MED ORDER — SENNOSIDES-DOCUSATE SODIUM 8.6-50 MG PO TABS
1.0000 | ORAL_TABLET | Freq: Every evening | ORAL | Status: DC | PRN
  Administered 2019-04-11 – 2019-04-14 (×6): 1 via ORAL
  Filled 2019-04-10 (×7): qty 1

## 2019-04-10 MED ORDER — INSULIN ASPART 100 UNIT/ML ~~LOC~~ SOLN
0.0000 [IU] | Freq: Three times a day (TID) | SUBCUTANEOUS | Status: DC
  Administered 2019-04-11 – 2019-04-12 (×2): 2 [IU] via SUBCUTANEOUS
  Administered 2019-04-12: 09:00:00 1 [IU] via SUBCUTANEOUS
  Administered 2019-04-12 – 2019-04-13 (×2): 2 [IU] via SUBCUTANEOUS
  Administered 2019-04-13: 13:00:00 1 [IU] via SUBCUTANEOUS
  Administered 2019-04-13 – 2019-04-14 (×2): 2 [IU] via SUBCUTANEOUS
  Filled 2019-04-10 (×8): qty 1

## 2019-04-10 MED ORDER — INSULIN ASPART 100 UNIT/ML ~~LOC~~ SOLN: 0.0000 [IU] | Freq: Every day | SUBCUTANEOUS | Status: DC

## 2019-04-10 MED ORDER — ASPIRIN EC 81 MG PO TBEC
81.0000 mg | DELAYED_RELEASE_TABLET | Freq: Every day | ORAL | Status: DC
  Administered 2019-04-11 – 2019-04-14 (×4): 81 mg via ORAL
  Filled 2019-04-10 (×4): qty 1

## 2019-04-10 MED ORDER — MORPHINE SULFATE (PF) 4 MG/ML IV SOLN
4.0000 mg | Freq: Once | INTRAVENOUS | Status: AC
Start: 2019-04-10 — End: 2019-04-10
  Administered 2019-04-10: 4 mg via INTRAVENOUS
  Filled 2019-04-10: qty 1

## 2019-04-10 MED ORDER — HYDRALAZINE HCL 50 MG PO TABS
25.0000 mg | ORAL_TABLET | Freq: Three times a day (TID) | ORAL | Status: DC | PRN
  Filled 2019-04-10: qty 1

## 2019-04-10 MED ORDER — ONDANSETRON HCL 4 MG/2ML IJ SOLN
4.0000 mg | Freq: Three times a day (TID) | INTRAMUSCULAR | Status: DC | PRN
  Administered 2019-04-10 – 2019-04-14 (×5): 4 mg via INTRAVENOUS
  Filled 2019-04-10 (×5): qty 2

## 2019-04-10 MED ORDER — IOHEXOL 300 MG/ML  SOLN
100.0000 mL | Freq: Once | INTRAMUSCULAR | Status: AC | PRN
  Administered 2019-04-10: 100 mL via INTRAVENOUS
  Filled 2019-04-10: qty 100

## 2019-04-10 MED ORDER — ENOXAPARIN SODIUM 40 MG/0.4ML ~~LOC~~ SOLN
40.0000 mg | SUBCUTANEOUS | Status: DC
  Administered 2019-04-11 – 2019-04-13 (×3): 40 mg via SUBCUTANEOUS
  Filled 2019-04-10 (×3): qty 0.4

## 2019-04-10 MED ORDER — ACETAMINOPHEN 325 MG PO TABS
650.0000 mg | ORAL_TABLET | Freq: Four times a day (QID) | ORAL | Status: DC | PRN
  Administered 2019-04-11 – 2019-04-13 (×3): 650 mg via ORAL
  Filled 2019-04-10 (×3): qty 2

## 2019-04-10 MED ORDER — SODIUM CHLORIDE 0.9 % IV SOLN: INTRAVENOUS | Status: DC

## 2019-04-10 MED ORDER — ONDANSETRON HCL 4 MG/2ML IJ SOLN
4.0000 mg | Freq: Once | INTRAMUSCULAR | Status: AC
  Administered 2019-04-10: 4 mg via INTRAVENOUS
  Filled 2019-04-10: qty 2

## 2019-04-10 MED ORDER — ACETAMINOPHEN 650 MG RE SUPP: 650.0000 mg | Freq: Four times a day (QID) | RECTAL | Status: DC | PRN

## 2019-04-10 MED ORDER — ATORVASTATIN CALCIUM 20 MG PO TABS
80.0000 mg | ORAL_TABLET | Freq: Every day | ORAL | Status: DC
  Administered 2019-04-11: 08:00:00 80 mg via ORAL
  Filled 2019-04-10: qty 4

## 2019-04-10 MED ORDER — OXYCODONE-ACETAMINOPHEN 5-325 MG PO TABS
1.0000 | ORAL_TABLET | ORAL | Status: DC | PRN
  Administered 2019-04-13 – 2019-04-14 (×2): 1 via ORAL
  Filled 2019-04-10 (×2): qty 1

## 2019-04-10 MED ORDER — MORPHINE SULFATE (PF) 4 MG/ML IV SOLN
4.0000 mg | INTRAVENOUS | Status: DC | PRN
  Administered 2019-04-12 (×2): 4 mg via INTRAVENOUS
  Filled 2019-04-10 (×2): qty 1

## 2019-04-10 MED ORDER — LACTATED RINGERS IV BOLUS
1000.0000 mL | Freq: Once | INTRAVENOUS | Status: AC
  Administered 2019-04-10: 1000 mL via INTRAVENOUS

## 2019-04-10 NOTE — ED Triage Notes (Signed)
Patient presents to the ED with constipation.  Patient states she had two small bowel movements yesterday.  Patient reports a lack of appetite.  Patient states she was in [place] at the end of November for a bowel problem.  Patient reports some "numbness to her left lower quadrant.

## 2019-04-10 NOTE — H&P (Signed)
History and Physical    Dawn Shepherd [id number] DOB: 1950-12-05 DOA: 04/10/2019  Referring MD/NP/PA:   PCP: Dion Body, MD   Patient coming from:  The patient is coming from home.  At baseline, pt is independent for most of ADL.        Chief Complaint: Nausea, vomiting  HPI: Dawn Shepherd is a 68 y.o. female with medical history significant of hypertension, hyperlipidemia, diabetes mellitus, skin melanoma, who presents with nausea vomiting and abdominal pain.   Pt stats that she had initially had removal of melanoma from her arm about 1 month ago at [facility], subsequently required appendectomy on November 20. Since then, she states that "my stomach has not been right". She states that most recently she has had frequent nausea and vomiting, inability to tolerate p.o. over the past few days. This is been associated with "numbness" in left side of abdomen, no abdominal pain. Patient denies any chest pain, fever, chills, shortness breath, cough, symptoms of UTI or unilateral weakness.  ED Course: pt was found to have lipase 282, urinalysis (clear appearance, small amount of leukocyte, no bacteria, WBC 6-10), electrolytes renal function okay, temperature normal, blood pressure 106/74, heart rate 116, RR 18, oxygen saturation 96% on room air.  Patient is placed on MedSurg [place] for observation.  # CT-abd/pelvis: 1. No acute abnormalities of the abdomen or pelvis. 2. Chronic cholelithiasis. 3. Aortic Atherosclerosis (ICD10-I70.0).  Review of Systems:   General: no fevers, chills, no body weight gain, has poor appetite, has fatigue HEENT: no blurry vision, hearing changes or sore throat Respiratory: no dyspnea, coughing, wheezing CV: no chest pain, no palpitations GI: has nausea, vomiting, abdominal discomfort, no diarrhea, has constipation GU: no dysuria, burning on urination, increased urinary frequency, hematuria  Ext: no leg edema Neuro: no unilateral weakness, numbness, or  tingling, no vision change or hearing loss Skin: no rash, no skin tear. MSK: No muscle spasm, no deformity, no limitation of range of movement in spin Heme: No easy bruising.  Travel history: No recent long distant travel.  Allergy: No Known Allergies  Past Medical History:  Diagnosis Date  . Hypertension   . Skin cancer (melanoma) ([place]) 2005    Past Surgical History:  Procedure Laterality Date  . APPENDECTOMY    . BREAST CYST EXCISION Right   . BREAST EXCISIONAL BIOPSY Left    benign  . SKIN SURGERY     right wrist skin cancer.   . TUBAL LIGATION      Social History:  reports that she has quit smoking. Her smoking use included cigarettes. She has a 20.00 pack-year smoking history. She has never used smokeless tobacco. She reports that she does not drink alcohol or use drugs.  Family History:  Family History  Problem Relation Age of Onset  . Heart attack Mother 83  . Heart attack Father 50  . Breast cancer Neg Hx      Prior to Admission medications   Medication Sig Start Date End Date Taking? Authorizing Provider  aspirin EC 81 MG tablet Take 81 mg by mouth daily.    [provider]  atorvastatin (LIPITOR) 80 MG tablet Take 80 mg by mouth daily. 10/19/17   [provider]  lisinopril-hydrochlorothiazide (PRINZIDE,ZESTORETIC) 20-25 MG tablet Take 1 tablet by mouth daily. 10/22/17   [provider]  metFORMIN (GLUCOPHAGE) 500 MG tablet Take 1,000 mg by mouth daily.    [provider]    Physical Exam: Vitals:   04/10/19 [id number]  04/10/19 1302  BP: 106/74   Pulse: (!) 116   Resp: 18   Temp: 98.2 F (36.8 C)   TempSrc: Oral   SpO2: 96%   Weight:  70.6 kg  Height:  5\' 4"  (1.626 m)   General: Not in acute distress HEENT:       Eyes: PERRL, EOMI, no scleral icterus.       ENT: No discharge from the ears and nose, no pharynx injection, no tonsillar enlargement.        Neck: No JVD, no bruit, no mass felt. Heme: No neck lymph node  enlargement. Cardiac: S1/S2, RRR, No murmurs, No gallops or rubs. Respiratory: No rales, wheezing, rhonchi or rubs. GI: Soft, nondistended, nontender, no rebound pain, no organomegaly, BS present. GU: No hematuria Ext: No pitting leg edema bilaterally. 2+DP/PT pulse bilaterally. Musculoskeletal: No joint deformities, No joint redness or warmth, no limitation of ROM in spin. Skin: No rashes.  Neuro: Alert, oriented X3, cranial nerves II-XII grossly intact, moves all extremities normally. Psych: Patient is not psychotic, no suicidal or hemocidal ideation.  Labs on Admission: I have personally reviewed following labs and imaging studies  CBC: Recent Labs  Lab 04/10/19 1305  WBC 10.2  HGB 13.1  HCT 39.2  MCV 85.0  PLT [id number]   Basic Metabolic Panel: Recent Labs  Lab 04/10/19 1305  NA 131*  K 4.2  CL 95*  CO2 24  GLUCOSE 259*  BUN 17  CREATININE 0.98  CALCIUM 10.5*   GFR: Estimated Creatinine Clearance: 53.7 mL/min (by C-G formula based on SCr of 0.98 mg/dL). Liver Function Tests: Recent Labs  Lab 04/10/19 1305  AST 18  ALT 17  ALKPHOS 142*  BILITOT 0.6  PROT 7.9  ALBUMIN 4.0   Recent Labs  Lab 04/10/19 1305  LIPASE 282*   No results for input(s): AMMONIA in the last 168 hours. Coagulation Profile: No results for input(s): INR, PROTIME in the last 168 hours. Cardiac Enzymes: No results for input(s): CKTOTAL, CKMB, CKMBINDEX, TROPONINI in the last 168 hours. BNP (last 3 results) No results for input(s): PROBNP in the last 8760 hours. HbA1C: No results for input(s): HGBA1C in the last 72 hours. CBG: No results for input(s): GLUCAP in the last 168 hours. Lipid Profile: No results for input(s): CHOL, HDL, LDLCALC, TRIG, CHOLHDL, LDLDIRECT in the last 72 hours. Thyroid Function Tests: No results for input(s): TSH, T4TOTAL, FREET4, T3FREE, THYROIDAB in the last 72 hours. Anemia Panel: No results for input(s): VITAMINB12, FOLATE, FERRITIN, TIBC, IRON, RETICCTPCT  in the last 72 hours. Urine analysis:    Component Value Date/Time   COLORURINE YELLOW (A) 04/10/2019 1305   APPEARANCEUR CLOUDY (A) 04/10/2019 1305   APPEARANCEUR Clear 04/27/2013 1312   LABSPEC 1.023 04/10/2019 1305   LABSPEC 1.010 04/27/2013 1312   PHURINE 5.0 04/10/2019 1305   GLUCOSEU 50 (A) 04/10/2019 1305   GLUCOSEU 50 mg/dL 04/27/2013 1312   HGBUR NEGATIVE 04/10/2019 1305   BILIRUBINUR NEGATIVE 04/10/2019 1305   BILIRUBINUR Negative 04/27/2013 1312   KETONESUR NEGATIVE 04/10/2019 1305   PROTEINUR 100 (A) 04/10/2019 1305   NITRITE NEGATIVE 04/10/2019 1305   LEUKOCYTESUR SMALL (A) 04/10/2019 1305   LEUKOCYTESUR Negative 04/27/2013 1312   Sepsis Labs: @LABRCNTIP (procalcitonin:4,lacticidven:4) )No results found for this or any previous visit (from the past 240 hour(s)).   Radiological Exams on Admission: CT Abdomen Pelvis W Contrast  Result Date: 04/10/2019 CLINICAL DATA:  Epigastric pain and vomiting. Nausea. EXAM: CT ABDOMEN AND PELVIS WITH CONTRAST TECHNIQUE: Multidetector  CT imaging of the abdomen and pelvis was performed using the standard protocol following bolus administration of intravenous contrast. CONTRAST:  19mL OMNIPAQUE IOHEXOL 300 MG/ML  SOLN COMPARISON:  CT scan dated 06/07/2010 FINDINGS: Lower chest: There is minimal atelectasis at the right lung base laterally. There are what appear to be surgical staples in the parenchyma of the left lower lobe posteriorly with adjacent linear atelectasis or more likely scarring. Heart size is normal. Coronary artery calcifications. Hepatobiliary: Liver parenchyma is normal. 8 mm stone in neck of the gallbladder. No gallbladder wall thickening or dilated bile ducts. Pancreas: Unremarkable. No pancreatic ductal dilatation or surrounding inflammatory changes. Spleen: Normal in size without focal abnormality. Adrenals/Urinary Tract: Adrenal glands are unremarkable. Kidneys are normal, without renal calculi, focal lesion, or  hydronephrosis. Bladder is unremarkable. Stomach/Bowel: Stomach is within normal limits. Appendix has been removed. No evidence of bowel wall thickening, distention, or inflammatory changes. Vascular/Lymphatic: Aortic atherosclerosis. No enlarged abdominal or pelvic lymph nodes. Reproductive: Uterus and bilateral adnexa are unremarkable. Other: No abdominal wall hernia or abnormality. No abdominopelvic ascites. Musculoskeletal: No acute or significant osseous findings. IMPRESSION: 1. No acute abnormalities of the abdomen or pelvis. 2. Chronic cholelithiasis. 3. Aortic Atherosclerosis (ICD10-I70.0). Electronically Signed   By: Lorriane Shire M.D.   On: 04/10/2019 15:18     EKG: Independently reviewed.  Sinus rhythm, QTC 435, LAE, poor R wave progression  Assessment/Plan Principal Problem:   Acute pancreatitis Active Problems:   Hyperlipidemia   Essential hypertension   Hypercalcemia   Diabetes mellitus without complication (HCC)   Acute pancreatitis: lipase 282.  Etiology is not clear.  Patient does not drink alcohol.  Patient is taking Prinzide (lisinopril-HCTZ), which could be the offending medications. CT scan showed chronic cholelithiasis, otherwise negative.  -will place in med-surg bed for observation -NPO for pancreatitis -IVF: 1L RL in ED, and then at 125 cc/hr of NS -prn IV morphine for pain control -prn IV zofran for nausea -check triglyceride level -->188  Hyperlipidemia: -lipitor  Essential hypertension: -hold prinzide -prn hydralazine  Hypercalcemia: mild Ca 10.5, possibly due to dehydration -IV fluid as above  Diabetes mellitus without complication ([place]): Last A1c 8.3 on 04/07/19 , poorly controled. Patient is taking Metformin at home -SSI   DVT ppx:   SQ Lovenox Code Status: Full code Family Communication:    Yes, patient's daughter on the phoen Disposition Plan:  Anticipate discharge back to previous home environment Consults called:  none Admission status:  medical floor/obs  Date of Service 04/10/2019    [facility]   If 7PM-7AM, please contact night-coverage [email or web address] Password [email or web address] 04/10/2019, 3:59 PM

## 2019-04-10 NOTE — ED Triage Notes (Signed)
Previous note written by Vicente Males, RN.

## 2019-04-10 NOTE — ED Notes (Signed)
See triage note  Presents with abd discomfort and nausea    States she has had several surgeries in the month on nov

## 2019-04-10 NOTE — Plan of Care (Signed)
Acute Pancreatitis and Lipase Test (exit care)

## 2019-04-10 NOTE — ED Provider Notes (Signed)
[facility] Emergency Department Provider Note   ____________________________________________   First MD Initiated Contact with Patient 04/10/19 1346     (approximate)  I have reviewed the triage vital signs and the nursing notes.   HISTORY  Chief Complaint Constipation    HPI Dawn Shepherd is a 68 y.o. female with past medical history of hypertension and melanoma presents to the ED complaining of abdominal pain and vomiting.  Patient reports that "ever since I had my surgery my stomach has not been right".  Patient initially had removal of melanoma from her arm about 1 month ago at [facility], subsequently required appendectomy on November 20.  She states that most recently she has had frequent nausea and vomiting, inability to tolerate p.o. over the past few days.  This is been associated with "numbness" in her epigastrium and left upper quadrant, but she denies any pain.  She has not had any fevers, chills, cough, chest pain, shortness of breath, dysuria, or hematuria.  She states she had 2 small bowel movements yesterday that were otherwise normal.        Past Medical History:  Diagnosis Date  . Hypertension   . Skin cancer (melanoma) ([place]) 2005    Patient Active Problem List   Diagnosis Date Noted  . Acute pancreatitis 04/10/2019  . Hypercalcemia 04/10/2019  . Diabetes mellitus without complication ([place]) [id number]  . Abnormal EKG 02/13/2013  . Hyperlipidemia 02/13/2013  . Essential hypertension 02/13/2013  . Frequent headaches 02/13/2013    Past Surgical History:  Procedure Laterality Date  . APPENDECTOMY    . BREAST CYST EXCISION Right   . BREAST EXCISIONAL BIOPSY Left    benign  . SKIN SURGERY     right wrist skin cancer.   . TUBAL LIGATION      Prior to Admission medications   Medication Sig Start Date End Date Taking? Authorizing Provider  aspirin EC 81 MG tablet Take 81 mg by mouth daily.   Yes [provider]    atorvastatin (LIPITOR) 80 MG tablet Take 80 mg by mouth daily. 10/19/17  Yes [provider]  lisinopril-hydrochlorothiazide (PRINZIDE,ZESTORETIC) 20-25 MG tablet Take 1 tablet by mouth daily. 10/22/17  Yes [provider]  metFORMIN (GLUCOPHAGE) 1000 MG tablet Take 1,000 mg by mouth 2 (two) times daily.    Yes [provider]    Allergies Patient has no known allergies.  Family History  Problem Relation Age of Onset  . Heart attack Mother 68  . Heart attack Father 69  . Breast cancer Neg Hx     Social History Social History   Tobacco Use  . Smoking status: Former Smoker    Packs/day: 1.00    Years: 20.00    Pack years: 20.00    Types: Cigarettes  . Smokeless tobacco: Never Used  Substance Use Topics  . Alcohol use: No  . Drug use: No    Review of Systems  Constitutional: No fever/chills Eyes: No visual changes. ENT: No sore throat. Cardiovascular: Denies chest pain. Respiratory: Denies shortness of breath. Gastrointestinal: No abdominal pain.  Positive for vomiting.  No diarrhea.  No constipation. Genitourinary: Negative for dysuria. Musculoskeletal: Negative for back pain. Skin: Negative for rash. Neurological: Negative for headaches, focal weakness or numbness.  ____________________________________________   PHYSICAL EXAM:  VITAL SIGNS: ED Triage Vitals  Enc Vitals Group     BP 04/10/19 1244 106/74     Pulse Rate 04/10/19 1244 (!) 116  Resp 04/10/19 1244 18     Temp 04/10/19 1244 98.2 F (36.8 C)     Temp Source 04/10/19 1244 Oral     SpO2 04/10/19 1244 96 %     Weight 04/10/19 1302 155 lb 11.2 oz (70.6 kg)     Height 04/10/19 1302 5\' 4"  (1.626 m)     Head Circumference --      Peak Flow --      Pain Score 04/10/19 1301 7     Pain Loc --      Pain Edu? --      Excl. in [place]? --     Constitutional: Alert and oriented. Eyes: Conjunctivae are normal. Head: Atraumatic. Nose: No congestion/rhinnorhea. Mouth/Throat:  Mucous membranes are moist. Neck: Normal ROM Cardiovascular: Normal rate, regular rhythm. Grossly normal heart sounds. Respiratory: Normal respiratory effort.  No retractions. Lungs CTAB. Gastrointestinal: Soft and tender to palpation in the epigastrium and left upper quadrant. No distention. Genitourinary: deferred Musculoskeletal: No lower extremity tenderness nor edema. Neurologic:  Normal speech and language. No gross focal neurologic deficits are appreciated. Skin:  Skin is warm, dry and intact. No rash noted. Psychiatric: Mood and affect are normal. Speech and behavior are normal.  ____________________________________________   LABS (all labs ordered are listed, but only abnormal results are displayed)  Labs Reviewed  LIPASE, BLOOD - Abnormal; Notable for the following components:      Result Value   Lipase 282 (*)    All other components within normal limits  COMPREHENSIVE METABOLIC PANEL - Abnormal; Notable for the following components:   Sodium 131 (*)    Chloride 95 (*)    Glucose, Bld 259 (*)    Calcium 10.5 (*)    Alkaline Phosphatase 142 (*)    GFR calc non Af Amer 60 (*)    All other components within normal limits  URINALYSIS, COMPLETE (UACMP) WITH MICROSCOPIC - Abnormal; Notable for the following components:   Color, Urine YELLOW (*)    APPearance CLOUDY (*)    Glucose, UA 50 (*)    Protein, ur 100 (*)    Leukocytes,Ua SMALL (*)    Non Squamous Epithelial PRESENT (*)    All other components within normal limits  TRIGLYCERIDES - Abnormal; Notable for the following components:   Triglycerides 188 (*)    All other components within normal limits  HEMOGLOBIN A1C - Abnormal; Notable for the following components:   Hgb A1c MFr Bld 8.5 (*)    All other components within normal limits  GLUCOSE, CAPILLARY - Abnormal; Notable for the following components:   Glucose-Capillary 126 (*)    All other components within normal limits  URINE CULTURE  SARS CORONAVIRUS 2  (TAT 6-24 HRS)  CBC  HIV ANTIBODY (ROUTINE TESTING W REFLEX)  BASIC METABOLIC PANEL  CBC  LIPASE, BLOOD   ____________________________________________  EKG  ED ECG REPORT I, Blake Divine, the attending physician, personally viewed and interpreted this ECG.   Date: 04/10/2019  EKG Time: 15:07  Rate: 91  Rhythm: normal sinus rhythm  Axis: Normal  Intervals:none  ST&T Change: None   PROCEDURES  Procedure(s) performed (including Critical Care):  Procedures   ____________________________________________   INITIAL IMPRESSION / ASSESSMENT AND PLAN / ED COURSE       68 year old female with history of melanoma and recent appendectomy presents to the ED with difficulty tolerating p.o. over the past few days.  She states that her stomach feels "numb" and denies any pain, but on palpation she  has significant tenderness to her epigastrium and left upper quadrant.  Lab work is significant for hyperglycemia with no evidence of DKA, as well as elevation in her lipase.  She denies any history of pancreatitis, does not drink alcohol, and denies any history of cholelithiasis.  We will plan to further assess with CT of her abdomen.  She is currently receiving chemotherapy for melanoma, which was initially thought to be metastatic to her lungs, however she states recent biopsy was negative and she was told enlarged lymph nodes were secondary to her treatment.  I would have a low threshold for admission in this patient given her recent difficulties tolerating p.o. and elevated lipase.  CT scan negative for acute process.  Patient reports pain is improved, but she continues to have difficulty with nausea and is hesitant to take p.o.  Plan to admit for further evaluation of pancreatitis with no clear cause.  Case discussed with hospitalist, who accepts patient for admission.      ____________________________________________   FINAL CLINICAL IMPRESSION(S) / ED DIAGNOSES  Final diagnoses:   Acute pancreatitis without infection or necrosis, unspecified pancreatitis type  Non-intractable vomiting with nausea, unspecified vomiting type  Malignant melanoma, unspecified site [facility])     ED Discharge Orders    None       Note:  This document was prepared using Dragon voice recognition software and may include unintentional dictation errors.   Blake Divine, MD 04/10/19 2045

## 2019-04-11 DIAGNOSIS — Z79899 Other long term (current) drug therapy: Secondary | ICD-10-CM | POA: Diagnosis not present

## 2019-04-11 DIAGNOSIS — E119 Type 2 diabetes mellitus without complications: Secondary | ICD-10-CM | POA: Diagnosis not present

## 2019-04-11 DIAGNOSIS — Z87891 Personal history of nicotine dependence: Secondary | ICD-10-CM | POA: Diagnosis not present

## 2019-04-11 DIAGNOSIS — Z7984 Long term (current) use of oral hypoglycemic drugs: Secondary | ICD-10-CM | POA: Diagnosis not present

## 2019-04-11 DIAGNOSIS — Z7982 Long term (current) use of aspirin: Secondary | ICD-10-CM | POA: Diagnosis not present

## 2019-04-11 DIAGNOSIS — K859 Acute pancreatitis without necrosis or infection, unspecified: Secondary | ICD-10-CM | POA: Diagnosis not present

## 2019-04-11 DIAGNOSIS — Z20828 Contact with and (suspected) exposure to other viral communicable diseases: Secondary | ICD-10-CM | POA: Diagnosis present

## 2019-04-11 DIAGNOSIS — Z85820 Personal history of malignant melanoma of skin: Secondary | ICD-10-CM | POA: Diagnosis not present

## 2019-04-11 DIAGNOSIS — Z8249 Family history of ischemic heart disease and other diseases of the circulatory system: Secondary | ICD-10-CM | POA: Diagnosis not present

## 2019-04-11 DIAGNOSIS — E785 Hyperlipidemia, unspecified: Secondary | ICD-10-CM | POA: Diagnosis present

## 2019-04-11 DIAGNOSIS — I700 Atherosclerosis of aorta: Secondary | ICD-10-CM | POA: Diagnosis present

## 2019-04-11 DIAGNOSIS — R112 Nausea with vomiting, unspecified: Secondary | ICD-10-CM | POA: Diagnosis present

## 2019-04-11 DIAGNOSIS — Z9049 Acquired absence of other specified parts of digestive tract: Secondary | ICD-10-CM | POA: Diagnosis not present

## 2019-04-11 DIAGNOSIS — K8020 Calculus of gallbladder without cholecystitis without obstruction: Secondary | ICD-10-CM | POA: Diagnosis present

## 2019-04-11 DIAGNOSIS — I10 Essential (primary) hypertension: Secondary | ICD-10-CM | POA: Diagnosis not present

## 2019-04-11 LAB — URINE CULTURE

## 2019-04-11 LAB — CBC
HCT: 34.2 % — ABNORMAL LOW (ref 36.0–46.0)
Hemoglobin: 11.6 g/dL — ABNORMAL LOW (ref 12.0–15.0)
MCH: 28.1 pg (ref 26.0–34.0)
MCHC: 33.9 g/dL (ref 30.0–36.0)
MCV: 82.8 fL (ref 80.0–100.0)
Platelets: 283 10*3/uL (ref 150–400)
RBC: 4.13 MIL/uL (ref 3.87–5.11)
RDW: 12.8 % (ref 11.5–15.5)
WBC: 8.5 10*3/uL (ref 4.0–10.5)
nRBC: 0 % (ref 0.0–0.2)

## 2019-04-11 LAB — GLUCOSE, CAPILLARY
Glucose-Capillary: 153 mg/dL — ABNORMAL HIGH (ref 70–99)
Glucose-Capillary: 109 mg/dL — ABNORMAL HIGH (ref 70–99)
Glucose-Capillary: 110 mg/dL — ABNORMAL HIGH (ref 70–99)
Glucose-Capillary: 147 mg/dL — ABNORMAL HIGH (ref 70–99)

## 2019-04-11 LAB — BASIC METABOLIC PANEL
Anion gap: 11 (ref 5–15)
BUN: 14 mg/dL (ref 8–23)
CO2: 22 mmol/L (ref 22–32)
Calcium: 9.1 mg/dL (ref 8.9–10.3)
Chloride: 102 mmol/L (ref 98–111)
Creatinine, Ser: 0.85 mg/dL (ref 0.44–1.00)
GFR calc Af Amer: >60 mL/min (ref >60)
GFR calc non Af Amer: >60 mL/min (ref >60)
Glucose, Bld: 176 mg/dL — ABNORMAL HIGH (ref 70–99)
Potassium: 3.7 mmol/L (ref 3.5–5.1)
Sodium: 135 mmol/L (ref 135–145)

## 2019-04-11 LAB — LIPASE, BLOOD: Lipase: 641 U/L — ABNORMAL HIGH (ref 11–51)

## 2019-04-11 LAB — SARS CORONAVIRUS 2 (TAT 6-24 HRS): SARS Coronavirus 2: NEGATIVE

## 2019-04-11 MED ORDER — DIPHENHYDRAMINE HCL 25 MG PO CAPS
25.0000 mg | ORAL_CAPSULE | Freq: Once | ORAL | Status: AC
  Administered 2019-04-11: 01:00:00 25 mg via ORAL
  Filled 2019-04-11: qty 1

## 2019-04-11 NOTE — Progress Notes (Signed)
Brief Hx: Dawn Shepherd is a 68 y.o. female with medical history significant of hypertension, hyperlipidemia, diabetes mellitus, skin melanoma, who presents with nausea vomiting and abdominal pain. Pt stats that she had initially had removal of melanoma from her arm about 1 month ago at [facility], subsequently required appendectomy on November 20. Since then, she states that "my stomach has not been right". She states that most recently she has had frequent nausea and vomiting, inability to tolerate p.o. over the past few days.This is been associated with "numbness" in left side of abdomen, no abdominal pain. Patient is taking Prinzide (lisinopril-HCTZ), which could be the offending medications.  Subjective: Patient says she is not still having abdominal pain and nausea.  She is no longer vomiting in hospital.  Denies any fever or dysuria.  Patient wanted the care team to speak to her granddaughter on the phone while seeing the patient.  Objective: Vital signs in last 24 hours: Temp:  [97.9 F (36.6 C)-98.2 F (36.8 C)] 98.2 F (36.8 C) (12/18 0412) Pulse Rate:  [76-88] 86 (12/18 0412) Resp:  [18] 18 (12/18 0412) BP: (97-131)/(65-83) 131/83 (12/18 0412) SpO2:  [97 %-98 %] 97 % (12/18 0412) Weight:  [70.6 kg] 70.6 kg (12/17 1828)  Intake/Output from previous day: 12/17 0701 - 12/18 0700 In: 1516.1 [I.V.:1516.1] Out: -  Intake/Output this shift: No intake/output data recorded.  General: Appears to be slightly anxious  HEENT:       Eyes: PERRL, EOMI, no scleral icterus.       ENT: No discharge from the ears and nose, no pharynx injection, no tonsillar enlargement.        Neck: No JVD, no bruit, no mass felt. Heme: No neck lymph node enlargement. Cardiac: S1/S2, RRR, No murmurs, No gallops or rubs. Respiratory: No rales, wheezing, rhonchi or rubs. GI: Soft, nondistended. +BS.  Tender to palpation on epigastric and right upper quadrant Areas.  No rebound tenderness or guarding  appreciated. GU: No hematuria, negative for CVA tenderness Ext: No pitting leg edema bilaterally. 2+DP/PT pulse bilaterally. Musculoskeletal: No joint deformities, No joint redness or warmth, no limitation of ROM in spin. Skin: No rashes.  Neuro: Alert, oriented X3, cranial nerves II-XII grossly intact, moves all extremities normally. Psych: Patient is not psychotic, no suicidal or hemocidal ideation.  Results for orders placed or performed during the hospital encounter of 04/10/19 (from the past 24 hour(s))  Lipase, blood     Status: Abnormal   Collection Time: 04/10/19  1:05 PM  Result Value Ref Range   Lipase 282 (H) 11 - 51 U/L  Comprehensive metabolic panel     Status: Abnormal   Collection Time: 04/10/19  1:05 PM  Result Value Ref Range   Sodium 131 (L) 135 - 145 mmol/L   Potassium 4.2 3.5 - 5.1 mmol/L   Chloride 95 (L) 98 - 111 mmol/L   CO2 24 22 - 32 mmol/L   Glucose, Bld 259 (H) 70 - 99 mg/dL   BUN 17 8 - 23 mg/dL   Creatinine, Ser 0.98 0.44 - 1.00 mg/dL   Calcium 10.5 (H) 8.9 - 10.3 mg/dL   Total Protein 7.9 6.5 - 8.1 g/dL   Albumin 4.0 3.5 - 5.0 g/dL   AST 18 15 - 41 U/L   ALT 17 0 - 44 U/L   Alkaline Phosphatase 142 (H) 38 - 126 U/L   Total Bilirubin 0.6 0.3 - 1.2 mg/dL   GFR calc non Af Amer 60 (L) >60 mL/min  GFR calc Af Amer >60 >60 mL/min   Anion gap 12 5 - 15  CBC     Status: None   Collection Time: 04/10/19  1:05 PM  Result Value Ref Range   WBC 10.2 4.0 - 10.5 K/uL   RBC 4.61 3.87 - 5.11 MIL/uL   Hemoglobin 13.1 12.0 - 15.0 g/dL   HCT 39.2 36.0 - 46.0 %   MCV 85.0 80.0 - 100.0 fL   MCH 28.4 26.0 - 34.0 pg   MCHC 33.4 30.0 - 36.0 g/dL   RDW 12.5 11.5 - 15.5 %   Platelets 367 150 - 400 K/uL   nRBC 0.0 0.0 - 0.2 %  Urinalysis, Complete w Microscopic     Status: Abnormal   Collection Time: 04/10/19  1:05 PM  Result Value Ref Range   Color, Urine YELLOW (A) YELLOW   APPearance CLOUDY (A) CLEAR   Specific Gravity, Urine 1.023 1.005 - 1.030   pH 5.0  5.0 - 8.0   Glucose, UA 50 (A) NEGATIVE mg/dL   Hgb urine dipstick NEGATIVE NEGATIVE   Bilirubin Urine NEGATIVE NEGATIVE   Ketones, ur NEGATIVE NEGATIVE mg/dL   Protein, ur 100 (A) NEGATIVE mg/dL   Nitrite NEGATIVE NEGATIVE   Leukocytes,Ua SMALL (A) NEGATIVE   RBC / HPF 21-50 0 - 5 RBC/hpf   WBC, UA 6-10 0 - 5 WBC/hpf   Bacteria, UA NONE SEEN NONE SEEN   Squamous Epithelial / LPF 6-10 0 - 5   Mucus PRESENT    Hyaline Casts, UA PRESENT    Non Squamous Epithelial PRESENT (A) NONE SEEN  Triglycerides     Status: Abnormal   Collection Time: 04/10/19  1:05 PM  Result Value Ref Range   Triglycerides 188 (H) <150 mg/dL  Hemoglobin A1c     Status: Abnormal   Collection Time: 04/10/19  1:05 PM  Result Value Ref Range   Hgb A1c MFr Bld 8.5 (H) 4.8 - 5.6 %   Mean Plasma Glucose 197.25 mg/dL  SARS CORONAVIRUS 2 (TAT 6-24 HRS) Nasopharyngeal Nasopharyngeal Swab     Status: None   Collection Time: 04/10/19  3:36 PM   Specimen: Nasopharyngeal Swab  Result Value Ref Range   SARS Coronavirus 2 NEGATIVE NEGATIVE  HIV Antibody (routine testing w rflx)     Status: None   Collection Time: 04/10/19  3:52 PM  Result Value Ref Range   HIV Screen 4th Generation wRfx NON REACTIVE NON REACTIVE  Glucose, capillary     Status: Abnormal   Collection Time: 04/10/19  4:31 PM  Result Value Ref Range   Glucose-Capillary 126 (H) 70 - 99 mg/dL  Glucose, capillary     Status: Abnormal   Collection Time: 04/10/19  9:05 PM  Result Value Ref Range   Glucose-Capillary 137 (H) 70 - 99 mg/dL  Basic metabolic panel     Status: Abnormal   Collection Time: 04/11/19  6:17 AM  Result Value Ref Range   Sodium 135 135 - 145 mmol/L   Potassium 3.7 3.5 - 5.1 mmol/L   Chloride 102 98 - 111 mmol/L   CO2 22 22 - 32 mmol/L   Glucose, Bld 176 (H) 70 - 99 mg/dL   BUN 14 8 - 23 mg/dL   Creatinine, Ser 0.85 0.44 - 1.00 mg/dL   Calcium 9.1 8.9 - 10.3 mg/dL   GFR calc non Af Amer >60 >60 mL/min   GFR calc Af Amer >60 >60  mL/min   Anion gap 11 5 -  15  CBC     Status: Abnormal   Collection Time: 04/11/19  6:17 AM  Result Value Ref Range   WBC 8.5 4.0 - 10.5 K/uL   RBC 4.13 3.87 - 5.11 MIL/uL   Hemoglobin 11.6 (L) 12.0 - 15.0 g/dL   HCT 34.2 (L) 36.0 - 46.0 %   MCV 82.8 80.0 - 100.0 fL   MCH 28.1 26.0 - 34.0 pg   MCHC 33.9 30.0 - 36.0 g/dL   RDW 12.8 11.5 - 15.5 %   Platelets 283 150 - 400 K/uL   nRBC 0.0 0.0 - 0.2 %  Lipase, blood     Status: Abnormal   Collection Time: 04/11/19  6:17 AM  Result Value Ref Range   Lipase 641 (H) 11 - 51 U/L  Glucose, capillary     Status: Abnormal   Collection Time: 04/11/19  8:20 AM  Result Value Ref Range   Glucose-Capillary 153 (H) 70 - 99 mg/dL  Glucose, capillary     Status: Abnormal   Collection Time: 04/11/19 11:37 AM  Result Value Ref Range   Glucose-Capillary 109 (H) 70 - 99 mg/dL    Studies/Results: CT Abdomen Pelvis W Contrast  Result Date: 04/10/2019 CLINICAL DATA:  Epigastric pain and vomiting. Nausea. EXAM: CT ABDOMEN AND PELVIS WITH CONTRAST TECHNIQUE: Multidetector CT imaging of the abdomen and pelvis was performed using the standard protocol following bolus administration of intravenous contrast. CONTRAST:  187mL OMNIPAQUE IOHEXOL 300 MG/ML  SOLN COMPARISON:  CT scan dated 06/07/2010 FINDINGS: Lower chest: There is minimal atelectasis at the right lung base laterally. There are what appear to be surgical staples in the parenchyma of the left lower lobe posteriorly with adjacent linear atelectasis or more likely scarring. Heart size is normal. Coronary artery calcifications. Hepatobiliary: Liver parenchyma is normal. 8 mm stone in neck of the gallbladder. No gallbladder wall thickening or dilated bile ducts. Pancreas: Unremarkable. No pancreatic ductal dilatation or surrounding inflammatory changes. Spleen: Normal in size without focal abnormality. Adrenals/Urinary Tract: Adrenal glands are unremarkable. Kidneys are normal, without renal calculi, focal  lesion, or hydronephrosis. Bladder is unremarkable. Stomach/Bowel: Stomach is within normal limits. Appendix has been removed. No evidence of bowel wall thickening, distention, or inflammatory changes. Vascular/Lymphatic: Aortic atherosclerosis. No enlarged abdominal or pelvic lymph nodes. Reproductive: Uterus and bilateral adnexa are unremarkable. Other: No abdominal wall hernia or abnormality. No abdominopelvic ascites. Musculoskeletal: No acute or significant osseous findings. IMPRESSION: 1. No acute abnormalities of the abdomen or pelvis. 2. Chronic cholelithiasis. 3. Aortic Atherosclerosis (ICD10-I70.0). Electronically Signed   By: Lorriane Shire M.D.   On: 04/10/2019 15:18    Scheduled Meds: . aspirin EC  81 mg Oral Daily  . enoxaparin (LOVENOX) injection  40 mg Subcutaneous Q24H  . insulin aspart  0-5 Units Subcutaneous QHS  . insulin aspart  0-9 Units Subcutaneous TID WC   Continuous Infusions: . sodium chloride 125 mL/hr at 04/11/19 0825   PRN Meds:acetaminophen **OR** acetaminophen, hydrALAZINE, morphine injection, ondansetron (ZOFRAN) IV, oxyCODONE-acetaminophen, senna-docusate  Assessment/Plan: Principal Problem:   Acute pancreatitis Active Problems:   Hyperlipidemia   Essential hypertension   Hypercalcemia   Diabetes mellitus without complication ([place])   Acute pancreatitis: lipase 282 on admission.   - Etiology is not clear.  Patient does not drink alcohol.  Patient was taking Prinzide (lisinopril-HCTZ), which could be the offending medications. CT scan showed chronic cholelithiasis, otherwise negative. -Repeat lipase came back 641 this morning; repeat lipase -We will continue to hold Prinzide.  Also  hold statin for now as this may also cause pancreatitis. -Continue NPO for pancreatitis until pain starts to subside then we may start her on clear liquid diet -IVF: 1L RL in ED; continue 125 cc/hr of NS -prn IV morphine for pain control -prn IV zofran for nausea -check  triglyceride level -->188  Hyperlipidemia: -Was on Lipitor but hold for now  Essential hypertension: Normal -hold prinzide -prn hydralazine  Hypercalcemia: Improved now  -Was mildly elevated Ca 10.5, possibly due to dehydration -IV fluid  above  Diabetes mellitus without complication ([place]): Last A1c 8.3 on 04/07/19 , poorly controled. Patient is taking Metformin at home; hold Metformin -  SSI  DVT ppx:   SQ Lovenox Code Status: Full code Family Communication: Spoke with patient's granddaughter, Orma Render, as per patient's wish this morning inside patient's room on the phone.  Answered to all questions. Disposition Plan:  Anticipate discharge back to previous home environment Consults called:  none   LOS: 0 days   Melessa Cowell Izetta Dakin

## 2019-04-11 NOTE — Progress Notes (Signed)
Initial Nutrition Assessment  DOCUMENTATION CODES:   Not applicable  INTERVENTION:   RD will add supplements once diet advanced   NUTRITION DIAGNOSIS:   Inadequate oral intake related to acute illness as evidenced by NPO status.  GOAL:   Patient will meet greater than or equal to 90% of their needs  MONITOR:   Diet advancement, Labs, Weight trends, Skin, I & O's  REASON FOR ASSESSMENT:   Malnutrition Screening Tool    ASSESSMENT:   68 y.o. female with medical history significant of hypertension, hyperlipidemia, diabetes mellitus, skin melanoma, who presents with nausea vomiting and abdominal pain. s/p appendectomy on November 20  Pt with nausea, vomiting and numbness in her abdomen for several days pta; pt has also had poor po intake. CT scan negative for acute process but pt with chronic cholelithiasis. Pt currently NPO. RD will add supplements and MVI once diet advanced. Per chart, pt has lost 13lbs(8%) over the past 2 months; this is significant.   Medications reviewed and include: aspirin, lovenox, insulin, NaCl @125ml /hr  Labs reviewed: lipase 641(H) cbgs- 153, 109 x 24 hrs AIC 8.5(H)- 12/17  NUTRITION - FOCUSED PHYSICAL EXAM:    Most Recent Value  Orbital Region  No depletion  Upper Arm Region  No depletion  Thoracic and Lumbar Region  No depletion  Buccal Region  No depletion  Temple Region  No depletion  Clavicle Bone Region  No depletion  Clavicle and Acromion Bone Region  No depletion  Scapular Bone Region  No depletion  Dorsal Hand  No depletion  Patellar Region  No depletion  Anterior Thigh Region  No depletion  Posterior Calf Region  No depletion  Edema (RD Assessment)  None  Hair  Reviewed  Eyes  Reviewed  Mouth  Reviewed  Skin  Reviewed  Nails  Reviewed     Diet Order:   Diet Order            Diet NPO time specified Except for: Sips with Meds, Ice Chips  Diet effective now             EDUCATION NEEDS:   No education needs have  been identified at this time  Skin:  Skin Assessment: Reviewed RN Assessment(Ecchymosis, MASD)  Last BM:  12/16  Height:   Ht Readings from Last 1 Encounters:  04/10/19 5\' 4"  (1.626 m)    Weight:   Wt Readings from Last 1 Encounters:  04/10/19 70.6 kg    Ideal Body Weight:  54.5 kg  BMI:  Body mass index is 26.73 kg/m.  Estimated Nutritional Needs:   Kcal:  1500-1700kcal/day  Protein:  75-85g/day  Fluid:  >1.6L/day  Koleen Distance MS, RD, LDN Pager #- [phone] Office#- [phone] After Hours Pager: [phone]

## 2019-04-12 LAB — GLUCOSE, CAPILLARY
Glucose-Capillary: 136 mg/dL — ABNORMAL HIGH (ref 70–99)
Glucose-Capillary: 151 mg/dL — ABNORMAL HIGH (ref 70–99)
Glucose-Capillary: 160 mg/dL — ABNORMAL HIGH (ref 70–99)
Glucose-Capillary: 151 mg/dL — ABNORMAL HIGH (ref 70–99)

## 2019-04-12 MED ORDER — SIMETHICONE 80 MG PO CHEW
160.0000 mg | CHEWABLE_TABLET | Freq: Four times a day (QID) | ORAL | Status: DC | PRN
  Administered 2019-04-12 (×2): 160 mg via ORAL
  Filled 2019-04-12 (×3): qty 2

## 2019-04-12 MED ORDER — LISINOPRIL 20 MG PO TABS
20.0000 mg | ORAL_TABLET | Freq: Every day | ORAL | Status: DC
  Administered 2019-04-12 – 2019-04-14 (×3): 20 mg via ORAL
  Filled 2019-04-12 (×3): qty 1

## 2019-04-12 NOTE — Progress Notes (Signed)
PROGRESS NOTE    Dawn Shepherd  [id number] DOB: January 02, 1951 DOA: 04/10/2019 PCP: Dion Body, MD   Brief Narrative:  Dawn Shepherd Carnesis a 68 y.o.femalewith medical history significant ofhypertension, hyperlipidemia, diabetes mellitus, skin melanoma, who presents with nausea vomiting and abdominal pain. Pt stats that she had initially had removal of melanoma from her arm about 1 month ago at [facility], subsequently required appendectomy on November 20. She was given 2 doses of immunotherapy for her melanoma.  Found to have pancreatitis.  Subjective: Patient did had some nausea after dinner last night.  She was able to tolerate liquid diet for breakfast.  She continued to experience some left upper quadrant discomfort.  Was at bedside with a lot of questions-tried to explain her concerns. Patient was given 2 doses of immunotherapy at [facility] for a concern of metastatic melanoma.  Immunotherapy was stopped after a negative lung biopsy and PET scan.  Assessment & Plan:   Principal Problem:   Acute pancreatitis Active Problems:   Hyperlipidemia   Essential hypertension   Hypercalcemia   Diabetes mellitus without complication ([place])  Acute pancreatitis: Patient recently had 2 doses of immunotherapy which might be responsible for current illness.  No alcohol intake.  Triglyceride at 188.  Was also taking Prinzide which were held.  Statin was held too. CT scan with some chronic cholelithiasis, otherwise unremarkable. Seems improving as she was able to tolerate liquid diet today. -Continue conservative management with pain control and IV hydration. -Advance diet as tolerated. -Continue Zofran for nausea as needed.  Hypertension.  Blood pressure elevated today. Home dose of Prinzide was held due to concern for pancreatitis. -Restart home lisinopril. -Keep holding HCTZ. -Continue as needed hydralazine.  Hypercalcemia: Improved now  with IV hydration. -Continue to monitor.  Diabetes  mellitus without complication (HCC):Last [id number] on 04/07/19, poorly controled. Patient is takingMetforminat home; hold Metformin -  SSI  Objective: Vitals:   04/11/19 0412 04/11/19 1946 04/12/19 0525 04/12/19 1433  BP: 131/83 (!) 155/93 (!) 156/85 140/88  Pulse: 86 76 75 72  Resp: 18 17 20 16   Temp: 98.2 F (36.8 C) 98.3 F (36.8 C) 98.4 F (36.9 C) 98.6 F (37 C)  TempSrc:   Oral Oral  SpO2: 97% 96% 98% 99%  Weight:      Height:        Intake/Output Summary (Last 24 hours) at 04/12/2019 1530 Last data filed at 04/12/2019 1445 Gross per 24 hour  Intake 5128.94 ml  Output --  Net 5128.94 ml   Filed Weights   04/10/19 1302 04/10/19 1828  Weight: 70.6 kg 70.6 kg    Examination:  General exam: Appears calm and comfortable  Respiratory system: Clear to auscultation. Respiratory effort normal. Cardiovascular system: S1 & S2 heard, RRR. No JVD, murmurs, rubs, gallops or clicks. Gastrointestinal system: Soft, mild left upper quadrant tenderness, nondistended, bowel sounds positive. Central nervous system: Alert and oriented. No focal neurological deficits.Symmetric 5 x 5 power. Extremities: No edema, no cyanosis, pulses intact and symmetrical. Skin: No rashes, lesions or ulcers Psychiatry: Judgement and insight appear normal. Mood & affect appropriate.   DVT prophylaxis: Lovenox Code Status: Full Family Communication: Daughter and patient were updated at bedside Disposition Plan: Pending improvement.  Consultants:   None  Procedures:  Antimicrobials:   Data Reviewed: I have personally reviewed following labs and imaging studies  CBC: Recent Labs  Lab 04/10/19 1305 04/11/19 0617  WBC 10.2 8.5  HGB 13.1 11.6*  HCT 39.2 34.2*  MCV 85.0  82.8  PLT 367 [id number]   Basic Metabolic Panel: Recent Labs  Lab 04/10/19 1305 04/11/19 0617  NA 131* 135  K 4.2 3.7  CL 95* 102  CO2 24 22  GLUCOSE 259* 176*  BUN 17 14  CREATININE 0.98 0.85  CALCIUM 10.5* 9.1    GFR: Estimated Creatinine Clearance: 61.9 mL/min (by C-G formula based on SCr of 0.85 mg/dL). Liver Function Tests: Recent Labs  Lab 04/10/19 1305  AST 18  ALT 17  ALKPHOS 142*  BILITOT 0.6  PROT 7.9  ALBUMIN 4.0   Recent Labs  Lab 04/10/19 1305 04/11/19 0617  LIPASE 282* 641*   No results for input(s): AMMONIA in the last 168 hours. Coagulation Profile: No results for input(s): INR, PROTIME in the last 168 hours. Cardiac Enzymes: No results for input(s): CKTOTAL, CKMB, CKMBINDEX, TROPONINI in the last 168 hours. BNP (last 3 results) No results for input(s): PROBNP in the last 8760 hours. HbA1C: Recent Labs    04/10/19 1305  HGBA1C 8.5*   CBG: Recent Labs  Lab 04/11/19 1137 04/11/19 1706 04/11/19 2130 04/12/19 0750 04/12/19 1140  GLUCAP 109* 110* 147* 136* 151*   Lipid Profile: Recent Labs    04/10/19 1305  TRIG 188*   Thyroid Function Tests: No results for input(s): TSH, T4TOTAL, FREET4, T3FREE, THYROIDAB in the last 72 hours. Anemia Panel: No results for input(s): VITAMINB12, FOLATE, FERRITIN, TIBC, IRON, RETICCTPCT in the last 72 hours. Sepsis Labs: No results for input(s): PROCALCITON, LATICACIDVEN in the last 168 hours.  Recent Results (from the past 240 hour(s))  Urine culture     Status: Abnormal   Collection Time: 04/10/19  1:05 PM   Specimen: Urine, Random  Result Value Ref Range Status   Specimen Description   Final    URINE, RANDOM Performed at [facility], [address] [place], [place] [address]    Special Requests   Final    NONE Performed at [facility], [address] [place], [place] [address]    Culture MULTIPLE SPECIES PRESENT, SUGGEST RECOLLECTION (A)  Final   Report Status 04/11/2019 FINAL  Final  SARS CORONAVIRUS 2 (TAT 6-24 HRS) Nasopharyngeal Nasopharyngeal Swab     Status: None   Collection Time: 04/10/19  3:36 PM   Specimen: Nasopharyngeal Swab  Result Value Ref Range Status   SARS  Coronavirus 2 NEGATIVE NEGATIVE Final    Comment: (NOTE) SARS-CoV-2 target nucleic acids are NOT DETECTED. The SARS-CoV-2 RNA is generally detectable in upper and lower respiratory specimens during the acute phase of infection. Negative results do not preclude SARS-CoV-2 infection, do not rule out co-infections with other pathogens, and should not be used as the sole basis for treatment or other patient management decisions. Negative results must be combined with clinical observations, patient history, and epidemiological information. The expected result is Negative. Fact Sheet for Patients: SugarRoll.be Fact Sheet for Healthcare Providers: https://www.woods-mathews.com/ This test is not yet approved or cleared by the [place] FDA and  has been authorized for detection and/or diagnosis of SARS-CoV-2 by FDA under an Emergency Use Authorization (EUA). This EUA will remain  in effect (meaning this test can be used) for the duration of the COVID-19 declaration under Section 56 4(b)(1) of the Act, 21 U.S.C. section 360bbb-3(b)(1), unless the authorization is terminated or revoked sooner. Performed at [facility], [place] [address] [place], [place] [address]      Radiology Studies: No results found.  Scheduled Meds: . aspirin EC  81 mg Oral Daily  . enoxaparin (LOVENOX) injection  40 mg Subcutaneous Q24H  . insulin aspart  0-5 Units Subcutaneous QHS  . insulin aspart  0-9 Units Subcutaneous TID WC  . lisinopril  20 mg Oral Daily   Continuous Infusions: . sodium chloride 125 mL/hr at 04/12/19 1445     LOS: 1 day   Time spent: 45 minutes.  Lorella Nimrod, MD [facility] Pager [phone]  If 7PM-7AM, please contact night-coverage [email or web address] Password [facility] 04/12/2019, 3:30 PM   This record has been created using Systems analyst. Errors have been sought and corrected,but may not always be located.  Such creation errors do not reflect on the standard of care.

## 2019-04-13 LAB — RENAL FUNCTION PANEL
Albumin: 3.3 g/dL — ABNORMAL LOW (ref 3.5–5.0)
Anion gap: 9 (ref 5–15)
BUN: 5 mg/dL — ABNORMAL LOW (ref 8–23)
CO2: 21 mmol/L — ABNORMAL LOW (ref 22–32)
Calcium: 8.8 mg/dL — ABNORMAL LOW (ref 8.9–10.3)
Chloride: 104 mmol/L (ref 98–111)
Creatinine, Ser: 0.77 mg/dL (ref 0.44–1.00)
GFR calc Af Amer: >60 mL/min (ref >60)
GFR calc non Af Amer: >60 mL/min (ref >60)
Glucose, Bld: 197 mg/dL — ABNORMAL HIGH (ref 70–99)
Phosphorus: 2.9 mg/dL (ref 2.5–4.6)
Potassium: 3.8 mmol/L (ref 3.5–5.1)
Sodium: 134 mmol/L — ABNORMAL LOW (ref 135–145)

## 2019-04-13 LAB — LIPASE, BLOOD: Lipase: 451 U/L — ABNORMAL HIGH (ref 11–51)

## 2019-04-13 LAB — CBC
HCT: 35 % — ABNORMAL LOW (ref 36.0–46.0)
Hemoglobin: 11.6 g/dL — ABNORMAL LOW (ref 12.0–15.0)
MCH: 28.5 pg (ref 26.0–34.0)
MCHC: 33.1 g/dL (ref 30.0–36.0)
MCV: 86 fL (ref 80.0–100.0)
Platelets: 308 10*3/uL (ref 150–400)
RBC: 4.07 MIL/uL (ref 3.87–5.11)
RDW: 12.5 % (ref 11.5–15.5)
WBC: 9 10*3/uL (ref 4.0–10.5)
nRBC: 0 % (ref 0.0–0.2)

## 2019-04-13 LAB — GLUCOSE, CAPILLARY
Glucose-Capillary: 174 mg/dL — ABNORMAL HIGH (ref 70–99)
Glucose-Capillary: 134 mg/dL — ABNORMAL HIGH (ref 70–99)
Glucose-Capillary: 178 mg/dL — ABNORMAL HIGH (ref 70–99)
Glucose-Capillary: 167 mg/dL — ABNORMAL HIGH (ref 70–99)

## 2019-04-13 MED ORDER — MAGNESIUM HYDROXIDE 400 MG/5ML PO SUSP
30.0000 mL | Freq: Every day | ORAL | Status: DC
  Administered 2019-04-13: 30 mL via ORAL
  Filled 2019-04-13 (×2): qty 30

## 2019-04-13 MED ORDER — NON FORMULARY: 3.0000 mg | Freq: Every evening | Status: DC | PRN

## 2019-04-13 MED ORDER — MELATONIN 5 MG PO TABS
2.5000 mg | ORAL_TABLET | Freq: Every evening | ORAL | Status: DC | PRN
  Administered 2019-04-13: 01:00:00 2.5 mg via ORAL
  Filled 2019-04-13 (×2): qty 0.5

## 2019-04-13 MED ORDER — BISACODYL 10 MG RE SUPP
10.0000 mg | Freq: Every day | RECTAL | Status: DC | PRN
  Administered 2019-04-13: 10 mg via RECTAL
  Filled 2019-04-13: qty 1

## 2019-04-13 NOTE — Progress Notes (Signed)
PROGRESS NOTE    Dawn Shepherd  [id number] DOB: [birth year] DOA: 04/10/2019 PCP: Dion Body, MD   Brief Narrative:  Dawn Shepherd a 68 y.o.femalewith medical history significant ofhypertension, hyperlipidemia, diabetes mellitus, skin melanoma, who presents with nausea vomiting and abdominal pain. Pt stats that she had initially had removal of melanoma from her arm about 1 month ago at [facility], subsequently required appendectomy on November 20. She was given 2 doses of immunotherapy for her melanoma.  Found to have pancreatitis.  Subjective: Patient was complaining of abdominal pain and constipation.  Denies any nausea or vomiting.  She was able to tolerate full liquid diet.  She would like to stay another day to see if she can tolerate regular food.  Assessment & Plan:   Principal Problem:   Acute pancreatitis Active Problems:   Hyperlipidemia   Essential hypertension   Hypercalcemia   Diabetes mellitus without complication ([place])  Acute pancreatitis: Patient recently had 2 doses of immunotherapy which might be responsible for current illness.  No alcohol intake.  Triglyceride at 188.  Was also taking Prinzide which were held.  Statin was held too. CT scan with some chronic cholelithiasis, otherwise unremarkable. Seems improving as she was able to tolerate full liquid diet today. Lipase trending down. -Continue conservative management with pain control and IV hydration. -Advance diet as tolerated. -Continue Zofran for nausea as needed.  Hypertension.  Blood pressure elevated today. Home dose of Prinzide was held due to concern for pancreatitis. -Continue home lisinopril. -Keep holding HCTZ. -Continue as needed hydralazine.  Constipation.  She was having diffuse crampy abdominal pain most likely due to constipation.  Per patient MiraLAX normally does not work for her. -We will try milk of magnesia.  Hypercalcemia:  Resolved. -Continue to monitor.  Diabetes  mellitus without complication (HCC):Last [id number] on 04/07/19, poorly controled. Patient is takingMetforminat home; hold Metformin CBG between 134-174 today. -  SSI  Objective: Vitals:   04/12/19 1433 04/12/19 2006 04/13/19 0617 04/13/19 0753  BP: 140/88 (!) 150/94 (!) 157/88 (!) 151/98  Pulse: 72 77 84 80  Resp: 16 16 16 18   Temp: 98.6 F (37 C) 98.3 F (36.8 C) 97.9 F (36.6 C) 98 F (36.7 C)  TempSrc: Oral  Oral Oral  SpO2: 99% 98% 98% 100%  Weight:      Height:        Intake/Output Summary (Last 24 hours) at 04/13/2019 1314 Last data filed at 04/13/2019 0900 Gross per 24 hour  Intake 2515.19 ml  Output -  Net 2515.19 ml   Filed Weights   04/10/19 1302 04/10/19 1828  Weight: 70.6 kg 70.6 kg    Examination:  General exam: Appears calm and comfortable  Respiratory system: Clear to auscultation. Respiratory effort normal. Cardiovascular system: S1 & S2 heard, RRR. No JVD, murmurs, rubs, gallops or clicks. Gastrointestinal system: Soft, nontender, nondistended, bowel sounds positive. Central nervous system: Alert and oriented. No focal neurological deficits.Symmetric 5 x 5 power. Extremities: No edema, no cyanosis, pulses intact and symmetrical. Skin: No rashes, lesions or ulcers Psychiatry: Judgement and insight appear normal.  Appears anxious.  DVT prophylaxis: Lovenox Code Status: Full Family Communication: No family at bedside, updated the patient. Disposition Plan: Pending improvement.  Most likely home tomorrow.  Consultants:   None  Procedures:  Antimicrobials:   Data Reviewed: I have personally reviewed following labs and imaging studies  CBC: Recent Labs  Lab 04/10/19 1305 04/11/19 0617 04/13/19 0534  WBC 10.2 8.5 9.0  HGB 13.1  11.6* 11.6*  HCT 39.2 34.2* 35.0*  MCV 85.0 82.8 86.0  PLT 367 283 [id number]   Basic Metabolic Panel: Recent Labs  Lab 04/10/19 1305 04/11/19 0617 04/13/19 0534  NA 131* 135 134*  K 4.2 3.7 3.8  CL 95* 102 104   CO2 24 22 21*  GLUCOSE 259* 176* 197*  BUN 17 14 5*  CREATININE 0.98 0.85 0.77  CALCIUM 10.5* 9.1 8.8*  PHOS  --   --  2.9   GFR: Estimated Creatinine Clearance: 65.8 mL/min (by C-G formula based on SCr of 0.77 mg/dL). Liver Function Tests: Recent Labs  Lab 04/10/19 1305 04/13/19 0534  AST 18  --   ALT 17  --   ALKPHOS 142*  --   BILITOT 0.6  --   PROT 7.9  --   ALBUMIN 4.0 3.3*   Recent Labs  Lab 04/10/19 1305 04/11/19 0617 04/13/19 0534  LIPASE 282* 641* 451*   No results for input(s): AMMONIA in the last 168 hours. Coagulation Profile: No results for input(s): INR, PROTIME in the last 168 hours. Cardiac Enzymes: No results for input(s): CKTOTAL, CKMB, CKMBINDEX, TROPONINI in the last 168 hours. BNP (last 3 results) No results for input(s): PROBNP in the last 8760 hours. HbA1C: No results for input(s): HGBA1C in the last 72 hours. CBG: Recent Labs  Lab 04/12/19 1140 04/12/19 1558 04/12/19 2133 04/13/19 0751 04/13/19 1200  GLUCAP 151* 160* 151* 174* 134*   Lipid Profile: No results for input(s): CHOL, HDL, LDLCALC, TRIG, CHOLHDL, LDLDIRECT in the last 72 hours. Thyroid Function Tests: No results for input(s): TSH, T4TOTAL, FREET4, T3FREE, THYROIDAB in the last 72 hours. Anemia Panel: No results for input(s): VITAMINB12, FOLATE, FERRITIN, TIBC, IRON, RETICCTPCT in the last 72 hours. Sepsis Labs: No results for input(s): PROCALCITON, LATICACIDVEN in the last 168 hours.  Recent Results (from the past 240 hour(s))  Urine culture     Status: Abnormal   Collection Time: 04/10/19  1:05 PM   Specimen: Urine, Random  Result Value Ref Range Status   Specimen Description   Final    URINE, RANDOM Performed at [facility], [address], [place], [place] [address]    Special Requests   Final    NONE Performed at [facility], [address] [place], [place] [address]    Culture MULTIPLE SPECIES PRESENT, SUGGEST RECOLLECTION (A)  Final    Report Status 04/11/2019 FINAL  Final  SARS CORONAVIRUS 2 (TAT 6-24 HRS) Nasopharyngeal Nasopharyngeal Swab     Status: None   Collection Time: 04/10/19  3:36 PM   Specimen: Nasopharyngeal Swab  Result Value Ref Range Status   SARS Coronavirus 2 NEGATIVE NEGATIVE Final    Comment: (NOTE) SARS-CoV-2 target nucleic acids are NOT DETECTED. The SARS-CoV-2 RNA is generally detectable in upper and lower respiratory specimens during the acute phase of infection. Negative results do not preclude SARS-CoV-2 infection, do not rule out co-infections with other pathogens, and should not be used as the sole basis for treatment or other patient management decisions. Negative results must be combined with clinical observations, patient history, and epidemiological information. The expected result is Negative. Fact Sheet for Patients: SugarRoll.be Fact Sheet for Healthcare Providers: https://www.woods-mathews.com/ This test is not yet approved or cleared by the [place] FDA and  has been authorized for detection and/or diagnosis of SARS-CoV-2 by FDA under an Emergency Use Authorization (EUA). This EUA will remain  in effect (meaning this test can be used) for the duration  of the COVID-19 declaration under Section 56 4(b)(1) of the Act, 21 U.S.C. section 360bbb-3(b)(1), unless the authorization is terminated or revoked sooner. Performed at [facility], [place] [address] [place], [place] [address]      Radiology Studies: No results found.  Scheduled Meds: . aspirin EC  81 mg Oral Daily  . enoxaparin (LOVENOX) injection  40 mg Subcutaneous Q24H  . insulin aspart  0-5 Units Subcutaneous QHS  . insulin aspart  0-9 Units Subcutaneous TID WC  . lisinopril  20 mg Oral Daily  . magnesium hydroxide  30 mL Oral Daily   Continuous Infusions: . sodium chloride 125 mL/hr at 04/13/19 0759     LOS: 2 days   Time spent: 35 minutes.  Lorella Nimrod,  MD [facility] Pager [phone]  If 7PM-7AM, please contact night-coverage [email or web address] Password [facility] 04/13/2019, 1:14 PM   This record has been created using Systems analyst. Errors have been sought and corrected,but may not always be located. Such creation errors do not reflect on the standard of care.

## 2019-04-14 DIAGNOSIS — R1110 Vomiting, unspecified: Secondary | ICD-10-CM

## 2019-04-14 DIAGNOSIS — R112 Nausea with vomiting, unspecified: Secondary | ICD-10-CM

## 2019-04-14 LAB — GLUCOSE, CAPILLARY
Glucose-Capillary: 223 mg/dL — ABNORMAL HIGH (ref 70–99)
Glucose-Capillary: 176 mg/dL — ABNORMAL HIGH (ref 70–99)
Glucose-Capillary: 184 mg/dL — ABNORMAL HIGH (ref 70–99)

## 2019-04-14 MED ORDER — SIMETHICONE 80 MG PO CHEW: 160.0000 mg | CHEWABLE_TABLET | Freq: Four times a day (QID) | ORAL | 0 refills | Status: AC | PRN

## 2019-04-14 MED ORDER — ONDANSETRON HCL 4 MG PO TABS: 4.0000 mg | ORAL_TABLET | Freq: Every day | ORAL | 1 refills | Status: AC | PRN

## 2019-04-14 MED ORDER — LISINOPRIL 20 MG PO TABS: 40.0000 mg | ORAL_TABLET | Freq: Every day | ORAL | Status: DC

## 2019-04-14 MED ORDER — LISINOPRIL 40 MG PO TABS: 40.0000 mg | ORAL_TABLET | Freq: Every day | ORAL | 1 refills | Status: AC

## 2019-04-14 MED ORDER — OXYCODONE-ACETAMINOPHEN 5-325 MG PO TABS: 1.0000 | ORAL_TABLET | ORAL | 0 refills | Status: AC | PRN

## 2019-04-14 MED ORDER — MAGNESIUM HYDROXIDE 400 MG/5ML PO SUSP: 30.0000 mL | Freq: Every day | ORAL | 0 refills | Status: AC

## 2019-04-14 MED ORDER — SENNOSIDES-DOCUSATE SODIUM 8.6-50 MG PO TABS: 1.0000 | ORAL_TABLET | Freq: Every evening | ORAL | 0 refills | Status: AC | PRN

## 2019-04-14 NOTE — Care Management Important Message (Signed)
Important Message  Patient Details  Name: Dawn Shepherd MRN: [id number] Date of Birth: December 17, 1950   Medicare Important Message Given:  Yes     Juliann Pulse A Soren Lazarz 04/14/2019, 11:55 AM

## 2019-04-14 NOTE — Progress Notes (Signed)
Pt being discharged home, discharge instructions reviewed with pt, states understanding, pt with no complaints 

## 2019-04-14 NOTE — Discharge Summary (Addendum)
Physician Discharge Summary  Dawn Shepherd [id number] DOB: 07-15-1950 DOA: 04/10/2019  PCP: Dion Body, MD  Admit date: 04/10/2019 Discharge date: 04/14/2019  Admitted From: Home Disposition: Home  Recommendations for Outpatient Follow-up:  1. Follow up with PCP in 1-2 weeks 2. Please follow-up with [facility] tomorrow according to your scheduled appointment. 3. Please obtain BMP/CBC in one week 4. Please follow up on the following pending results: None  Home Health: No Equipment/Devices: None Discharge Condition: Stable CODE STATUS: Full Diet recommendation: Heart Healthy / Carb Modified   Brief/Interim Summary: Dawn Shepherd a 68 y.o.femalewith medical history significant ofhypertension, hyperlipidemia, diabetes mellitus, skin melanoma, who presents with nausea vomiting and abdominal pain. Pt stats that she had initially had removal of melanoma from her arm about 1 month ago at [facility], subsequently required appendectomy on November 20. She was given 2 doses of immunotherapy for her melanoma.  Found to have pancreatitis.  She was treated with IV hydration and resting her gut.  Later she was started on clear liquid and then was slowly advanced her diet to soft.  Patient started tolerating food.  Occasionally becomes nauseated with no vomiting.  Abdominal pain responded to Percocet.  She was given Percocet on discharge to take as needed.  She was advised to follow-up with her PCP.  Her lipase was improving on discharge. We make changes to her blood pressure medicine.  She was taking a combination pill of lisinopril and HCTZ.  We discontinued the HCTZ and increase the dose of lisinopril.  Her PCP should be able to follow-up and restart HCTZ if needed once improved from pancreatitis. We also advised her to keep holding will Lipitor for few more days before resuming it. She will continue with rest of her home meds.  Nevin Bloodgood from [facility] called regarding her  this morning, stating that they really think that this pancreatitis is related to her immune therapy and they are starting her on prednisone.  She already had an appointment to be seen at cancer center tomorrow.  Prescription was sent to her pharmacy by them.  Discharge Diagnoses:  Principal Problem:   Acute pancreatitis Active Problems:   Hyperlipidemia   Essential hypertension   Hypercalcemia   Diabetes mellitus without complication (HCC)   Non-intractable vomiting  Discharge Instructions  Discharge Instructions    Diet - low sodium heart healthy   Complete by: As directed    Discharge instructions   Complete by: As directed    It was pleasure taking care of you. I am making some changes to your medications for blood pressure.  I would like to hold HCTZ and increasing the dose of lisinopril for better control of your blood pressure and pancreatitis. Please hold your Lipitor for few more days before resuming it. You can use Percocet and Zofran as needed.  These pain medications can make you constipated please use milk of magnesia regularly while using these pain meds. Keep yourself well-hydrated and advance your diet very slowly.  Avoid big fatty meals. Please follow-up with your primary care physician within 1 to 2 weeks.   Increase activity slowly   Complete by: As directed      Allergies as of 04/14/2019   No Known Allergies     Medication List    STOP taking these medications   lisinopril-hydrochlorothiazide 20-25 MG tablet Commonly known as: ZESTORETIC     TAKE these medications   aspirin EC 81 MG tablet Take 81 mg by mouth daily.  atorvastatin 80 MG tablet Commonly known as: LIPITOR Take 80 mg by mouth daily.   lisinopril 40 MG tablet Commonly known as: ZESTRIL Take 1 tablet (40 mg total) by mouth daily. Start taking on: April 15, 2019   magnesium hydroxide 400 MG/5ML suspension Commonly known as: MILK OF MAGNESIA Take 30 mLs by mouth daily. Start  taking on: April 15, 2019   metFORMIN 1000 MG tablet Commonly known as: GLUCOPHAGE Take 1,000 mg by mouth 2 (two) times daily.   ondansetron 4 MG tablet Commonly known as: Zofran Take 1 tablet (4 mg total) by mouth daily as needed for nausea or vomiting.   oxyCODONE-acetaminophen 5-325 MG tablet Commonly known as: PERCOCET/ROXICET Take 1 tablet by mouth every 4 (four) hours as needed for moderate pain.   senna-docusate 8.6-50 MG tablet Commonly known as: Senokot-S Take 1 tablet by mouth at bedtime as needed for mild constipation.   simethicone 80 MG chewable tablet Commonly known as: MYLICON Chew 2 tablets (160 mg total) by mouth 4 (four) times daily as needed for flatulence.       No Known Allergies  Consultations:  None  Procedures/Studies: CT Abdomen Pelvis W Contrast  Result Date: 04/10/2019 CLINICAL DATA:  Epigastric pain and vomiting. Nausea. EXAM: CT ABDOMEN AND PELVIS WITH CONTRAST TECHNIQUE: Multidetector CT imaging of the abdomen and pelvis was performed using the standard protocol following bolus administration of intravenous contrast. CONTRAST:  189mL OMNIPAQUE IOHEXOL 300 MG/ML  SOLN COMPARISON:  CT scan dated 06/07/2010 FINDINGS: Lower chest: There is minimal atelectasis at the right lung base laterally. There are what appear to be surgical staples in the parenchyma of the left lower lobe posteriorly with adjacent linear atelectasis or more likely scarring. Heart size is normal. Coronary artery calcifications. Hepatobiliary: Liver parenchyma is normal. 8 mm stone in neck of the gallbladder. No gallbladder wall thickening or dilated bile ducts. Pancreas: Unremarkable. No pancreatic ductal dilatation or surrounding inflammatory changes. Spleen: Normal in size without focal abnormality. Adrenals/Urinary Tract: Adrenal glands are unremarkable. Kidneys are normal, without renal calculi, focal lesion, or hydronephrosis. Bladder is unremarkable. Stomach/Bowel: Stomach is  within normal limits. Appendix has been removed. No evidence of bowel wall thickening, distention, or inflammatory changes. Vascular/Lymphatic: Aortic atherosclerosis. No enlarged abdominal or pelvic lymph nodes. Reproductive: Uterus and bilateral adnexa are unremarkable. Other: No abdominal wall hernia or abnormality. No abdominopelvic ascites. Musculoskeletal: No acute or significant osseous findings. IMPRESSION: 1. No acute abnormalities of the abdomen or pelvis. 2. Chronic cholelithiasis. 3. Aortic Atherosclerosis (ICD10-I70.0). Electronically Signed   By: Lorriane Shire M.D.   On: 04/10/2019 15:18    Subjective: Patient was feeling little better on the day of discharge.  She did had some more nausea in the morning but able to tolerate food well.  Her constipation responded to milk of magnesia, she was advised to continue using it while taking Percocet to avoid constipation.  Discharge Exam: Vitals:   04/13/19 1528 04/14/19 0811  BP: (!) 150/86 (!) 169/82  Pulse: 75 73  Resp: 18 18  Temp: 98.4 F (36.9 C) 98 F (36.7 C)  SpO2: 98% 98%   Vitals:   04/13/19 0617 04/13/19 0753 04/13/19 1528 04/14/19 0811  BP: (!) 157/88 (!) 151/98 (!) 150/86 (!) 169/82  Pulse: 84 80 75 73  Resp: 16 18 18 18   Temp: 97.9 F (36.6 C) 98 F (36.7 C) 98.4 F (36.9 C) 98 F (36.7 C)  TempSrc: Oral Oral Oral Oral  SpO2: 98% 100% 98% 98%  Weight:      Height:        General: Pt is alert, awake, not in acute distress Cardiovascular: RRR, S1/S2 +, no rubs, no gallops Respiratory: CTA bilaterally, no wheezing, no rhonchi Abdominal: Soft, NT, ND, bowel sounds + Extremities: no edema, no cyanosis   The results of significant diagnostics from this hospitalization (including imaging, microbiology, ancillary and laboratory) are listed below for reference.    Microbiology: Recent Results (from the past 240 hour(s))  Urine culture     Status: Abnormal   Collection Time: 04/10/19  1:05 PM   Specimen:  Urine, Random  Result Value Ref Range Status   Specimen Description   Final    URINE, RANDOM Performed at [facility], [address], [place], [place] [address]    Special Requests   Final    NONE Performed at [facility], [address] [place], [place] [address]    Culture MULTIPLE SPECIES PRESENT, SUGGEST RECOLLECTION (A)  Final   Report Status 04/11/2019 FINAL  Final  SARS CORONAVIRUS 2 (TAT 6-24 HRS) Nasopharyngeal Nasopharyngeal Swab     Status: None   Collection Time: 04/10/19  3:36 PM   Specimen: Nasopharyngeal Swab  Result Value Ref Range Status   SARS Coronavirus 2 NEGATIVE NEGATIVE Final    Comment: (NOTE) SARS-CoV-2 target nucleic acids are NOT DETECTED. The SARS-CoV-2 RNA is generally detectable in upper and lower respiratory specimens during the acute phase of infection. Negative results do not preclude SARS-CoV-2 infection, do not rule out co-infections with other pathogens, and should not be used as the sole basis for treatment or other patient management decisions. Negative results must be combined with clinical observations, patient history, and epidemiological information. The expected result is Negative. Fact Sheet for Patients: SugarRoll.be Fact Sheet for Healthcare Providers: https://www.woods-mathews.com/ This test is not yet approved or cleared by the [place] FDA and  has been authorized for detection and/or diagnosis of SARS-CoV-2 by FDA under an Emergency Use Authorization (EUA). This EUA will remain  in effect (meaning this test can be used) for the duration of the COVID-19 declaration under Section 56 4(b)(1) of the Act, 21 U.S.C. section 360bbb-3(b)(1), unless the authorization is terminated or revoked sooner. Performed at [facility], [place] [address], [place], [place] [address]      Labs: BNP (last 3 results) No results for input(s): BNP in the last 8760 hours. Basic  Metabolic Panel: Recent Labs  Lab 04/10/19 1305 04/11/19 0617 04/13/19 0534  NA 131* 135 134*  K 4.2 3.7 3.8  CL 95* 102 104  CO2 24 22 21*  GLUCOSE 259* 176* 197*  BUN 17 14 5*  CREATININE 0.98 0.85 0.77  CALCIUM 10.5* 9.1 8.8*  PHOS  --   --  2.9   Liver Function Tests: Recent Labs  Lab 04/10/19 1305 04/13/19 0534  AST 18  --   ALT 17  --   ALKPHOS 142*  --   BILITOT 0.6  --   PROT 7.9  --   ALBUMIN 4.0 3.3*   Recent Labs  Lab 04/10/19 1305 04/11/19 0617 04/13/19 0534  LIPASE 282* 641* 451*   No results for input(s): AMMONIA in the last 168 hours. CBC: Recent Labs  Lab 04/10/19 1305 04/11/19 0617 04/13/19 0534  WBC 10.2 8.5 9.0  HGB 13.1 11.6* 11.6*  HCT 39.2 34.2* 35.0*  MCV 85.0 82.8 86.0  PLT 367 283 308   Cardiac Enzymes: No results for input(s): CKTOTAL,  CKMB, CKMBINDEX, TROPONINI in the last 168 hours. BNP: Invalid input(s): POCBNP CBG: Recent Labs  Lab 04/13/19 1200 04/13/19 1639 04/13/19 2133 04/14/19 0525 04/14/19 0809  GLUCAP 134* 178* 167* 176* 184*   D-Dimer No results for input(s): DDIMER in the last 72 hours. Hgb A1c No results for input(s): HGBA1C in the last 72 hours. Lipid Profile No results for input(s): CHOL, HDL, LDLCALC, TRIG, CHOLHDL, LDLDIRECT in the last 72 hours. Thyroid function studies No results for input(s): TSH, T4TOTAL, T3FREE, THYROIDAB in the last 72 hours.  Invalid input(s): FREET3 Anemia work up No results for input(s): VITAMINB12, FOLATE, FERRITIN, TIBC, IRON, RETICCTPCT in the last 72 hours. Urinalysis    Component Value Date/Time   COLORURINE YELLOW (A) 04/10/2019 1305   APPEARANCEUR CLOUDY (A) 04/10/2019 1305   APPEARANCEUR Clear 04/27/2013 1312   LABSPEC 1.023 04/10/2019 1305   LABSPEC 1.010 04/27/2013 1312   PHURINE 5.0 04/10/2019 1305   GLUCOSEU 50 (A) 04/10/2019 1305   GLUCOSEU 50 mg/dL 04/27/2013 1312   HGBUR NEGATIVE 04/10/2019 1305   BILIRUBINUR NEGATIVE 04/10/2019 1305   BILIRUBINUR  Negative 04/27/2013 1312   KETONESUR NEGATIVE 04/10/2019 1305   PROTEINUR 100 (A) 04/10/2019 1305   NITRITE NEGATIVE 04/10/2019 1305   LEUKOCYTESUR SMALL (A) 04/10/2019 1305   LEUKOCYTESUR Negative 04/27/2013 1312   Sepsis Labs Invalid input(s): PROCALCITONIN,  WBC,  LACTICIDVEN Microbiology Recent Results (from the past 240 hour(s))  Urine culture     Status: Abnormal   Collection Time: 04/10/19  1:05 PM   Specimen: Urine, Random  Result Value Ref Range Status   Specimen Description   Final    URINE, RANDOM Performed at [facility], [address] [place], [place] [address]    Special Requests   Final    NONE Performed at [facility], [address] [place], [place] [address]    Culture MULTIPLE SPECIES PRESENT, SUGGEST RECOLLECTION (A)  Final   Report Status 04/11/2019 FINAL  Final  SARS CORONAVIRUS 2 (TAT 6-24 HRS) Nasopharyngeal Nasopharyngeal Swab     Status: None   Collection Time: 04/10/19  3:36 PM   Specimen: Nasopharyngeal Swab  Result Value Ref Range Status   SARS Coronavirus 2 NEGATIVE NEGATIVE Final    Comment: (NOTE) SARS-CoV-2 target nucleic acids are NOT DETECTED. The SARS-CoV-2 RNA is generally detectable in upper and lower respiratory specimens during the acute phase of infection. Negative results do not preclude SARS-CoV-2 infection, do not rule out co-infections with other pathogens, and should not be used as the sole basis for treatment or other patient management decisions. Negative results must be combined with clinical observations, patient history, and epidemiological information. The expected result is Negative. Fact Sheet for Patients: SugarRoll.be Fact Sheet for Healthcare Providers: https://www.woods-mathews.com/ This test is not yet approved or cleared by the [place] FDA and  has been authorized for detection and/or diagnosis of SARS-CoV-2 by FDA under an Emergency Use  Authorization (EUA). This EUA will remain  in effect (meaning this test can be used) for the duration of the COVID-19 declaration under Section 56 4(b)(1) of the Act, 21 U.S.C. section 360bbb-3(b)(1), unless the authorization is terminated or revoked sooner. Performed at [facility], [place] [address], [place], [place] [address]     Time coordinating discharge: Over 30 minutes  SIGNED:  Lorella Nimrod, MD  [facility] 04/14/2019, 11:17 AM Pager [phone]  If 7PM-7AM, please contact night-coverage [email or web address] Password [email or web address]  This record has been created using Set designer  software. Errors have been sought and corrected,but may not always be located. Such creation errors do not reflect on the standard of care.

## 2019-05-28 ENCOUNTER — Other Ambulatory Visit: Payer: Self-pay | Admitting: Family Medicine

## 2019-05-28 DIAGNOSIS — Z1231 Encounter for screening mammogram for malignant neoplasm of breast: Secondary | ICD-10-CM

## 2019-06-25 ENCOUNTER — Ambulatory Visit
Admission: RE | Admit: 2019-06-25 | Discharge: 2019-06-25 | Disposition: A | Discharge status: Home / Self Care | Payer: Medicare HMO | Source: Ambulatory Visit | Attending: Family Medicine | Admitting: Family Medicine

## 2019-06-25 ENCOUNTER — Other Ambulatory Visit: Payer: Self-pay | Admitting: Family Medicine

## 2019-06-25 DIAGNOSIS — Z1231 Encounter for screening mammogram for malignant neoplasm of breast: Secondary | ICD-10-CM | POA: Insufficient documentation

## 2019-06-25 DIAGNOSIS — N644 Mastodynia: Secondary | ICD-10-CM

## 2019-06-27 ENCOUNTER — Other Ambulatory Visit: Payer: Self-pay

## 2019-06-27 ENCOUNTER — Ambulatory Visit
Admission: RE | Admit: 2019-06-27 | Discharge: 2019-06-27 | Disposition: A | Discharge status: Home / Self Care | Payer: Medicare HMO | Source: Ambulatory Visit | Attending: Family Medicine | Admitting: Family Medicine

## 2019-06-27 DIAGNOSIS — N644 Mastodynia: Secondary | ICD-10-CM | POA: Diagnosis present

## 2019-11-27 ENCOUNTER — Emergency Department
Admission: EM | Admit: 2019-11-27 | Discharge: 2019-11-27 | Discharge status: Against Medical Advice | Payer: Medicare HMO

## 2019-11-27 ENCOUNTER — Other Ambulatory Visit: Payer: Self-pay

## 2019-11-27 MED ORDER — SODIUM CHLORIDE 0.9% FLUSH: 3.0000 mL | Freq: Once | INTRAVENOUS | Status: DC

## 2019-11-27 NOTE — ED Triage Notes (Deleted)
Pt was sent from [facility] with c/o lower abd pain for the past couple of days, states today after urinating she has more intense pain. States they examined her at the urgent care and are concerned for either cervical or a bladder prolaspe

## 2020-01-21 ENCOUNTER — Other Ambulatory Visit: Payer: Medicare HMO

## 2020-01-21 DIAGNOSIS — Z20822 Contact with and (suspected) exposure to covid-19: Secondary | ICD-10-CM

## 2020-01-22 LAB — NOVEL CORONAVIRUS, NAA: SARS-CoV-2, NAA: NOT DETECTED

## 2020-01-22 LAB — SARS-COV-2, NAA 2 DAY TAT

## 2020-02-09 ENCOUNTER — Emergency Department: Payer: Medicare HMO

## 2020-02-09 ENCOUNTER — Encounter: Payer: Self-pay | Admitting: Emergency Medicine

## 2020-02-09 ENCOUNTER — Emergency Department
Admission: EM | Admit: 2020-02-09 | Discharge: 2020-02-09 | Disposition: A | Discharge status: Home / Self Care | Payer: Medicare HMO | Attending: Emergency Medicine | Admitting: Emergency Medicine

## 2020-02-09 DIAGNOSIS — Z85828 Personal history of other malignant neoplasm of skin: Secondary | ICD-10-CM | POA: Insufficient documentation

## 2020-02-09 DIAGNOSIS — E119 Type 2 diabetes mellitus without complications: Secondary | ICD-10-CM | POA: Diagnosis not present

## 2020-02-09 DIAGNOSIS — Z79899 Other long term (current) drug therapy: Secondary | ICD-10-CM | POA: Diagnosis not present

## 2020-02-09 DIAGNOSIS — Z87891 Personal history of nicotine dependence: Secondary | ICD-10-CM | POA: Diagnosis not present

## 2020-02-09 DIAGNOSIS — B9689 Other specified bacterial agents as the cause of diseases classified elsewhere: Secondary | ICD-10-CM | POA: Insufficient documentation

## 2020-02-09 DIAGNOSIS — Z7984 Long term (current) use of oral hypoglycemic drugs: Secondary | ICD-10-CM | POA: Diagnosis not present

## 2020-02-09 DIAGNOSIS — I10 Essential (primary) hypertension: Secondary | ICD-10-CM | POA: Diagnosis not present

## 2020-02-09 DIAGNOSIS — N3001 Acute cystitis with hematuria: Secondary | ICD-10-CM | POA: Insufficient documentation

## 2020-02-09 DIAGNOSIS — Z7982 Long term (current) use of aspirin: Secondary | ICD-10-CM | POA: Diagnosis not present

## 2020-02-09 DIAGNOSIS — R319 Hematuria, unspecified: Secondary | ICD-10-CM | POA: Diagnosis present

## 2020-02-09 DIAGNOSIS — N3091 Cystitis, unspecified with hematuria: Secondary | ICD-10-CM

## 2020-02-09 LAB — COMPREHENSIVE METABOLIC PANEL
ALT: 22 U/L (ref 0–44)
AST: 17 U/L (ref 15–41)
Albumin: 4.5 g/dL (ref 3.5–5.0)
Alkaline Phosphatase: 100 U/L (ref 38–126)
Anion gap: 13 (ref 5–15)
BUN: 29 mg/dL — ABNORMAL HIGH (ref 8–23)
CO2: 25 mmol/L (ref 22–32)
Calcium: 9.5 mg/dL (ref 8.9–10.3)
Chloride: 102 mmol/L (ref 98–111)
Creatinine, Ser: 1.22 mg/dL — ABNORMAL HIGH (ref 0.44–1.00)
GFR, Estimated: 45 mL/min — ABNORMAL LOW (ref >60)
Glucose, Bld: 223 mg/dL — ABNORMAL HIGH (ref 70–99)
Potassium: 3.6 mmol/L (ref 3.5–5.1)
Sodium: 140 mmol/L (ref 135–145)
Total Bilirubin: 0.9 mg/dL (ref 0.3–1.2)
Total Protein: 7.1 g/dL (ref 6.5–8.1)

## 2020-02-09 LAB — URINALYSIS, COMPLETE (UACMP) WITH MICROSCOPIC
Bacteria, UA: NONE SEEN
RBC / HPF: >50 RBC/hpf — ABNORMAL HIGH (ref 0–5)

## 2020-02-09 LAB — CBC
HCT: 41.6 % (ref 36.0–46.0)
Hemoglobin: 14.1 g/dL (ref 12.0–15.0)
MCH: 29.5 pg (ref 26.0–34.0)
MCHC: 33.9 g/dL (ref 30.0–36.0)
MCV: 87 fL (ref 80.0–100.0)
Platelets: 387 10*3/uL (ref 150–400)
RBC: 4.78 MIL/uL (ref 3.87–5.11)
RDW: 13.1 % (ref 11.5–15.5)
WBC: 13.9 10*3/uL — ABNORMAL HIGH (ref 4.0–10.5)
nRBC: 0 % (ref 0.0–0.2)

## 2020-02-09 MED ORDER — ONDANSETRON 4 MG PO TBDP: 4.0000 mg | ORAL_TABLET | Freq: Once | ORAL | Status: DC

## 2020-02-09 MED ORDER — SODIUM CHLORIDE 0.9 % IV BOLUS
1000.0000 mL | Freq: Once | INTRAVENOUS | Status: AC
  Administered 2020-02-09: 1000 mL via INTRAVENOUS

## 2020-02-09 MED ORDER — OXYCODONE-ACETAMINOPHEN 7.5-325 MG PO TABS: 1.0000 | ORAL_TABLET | Freq: Once | ORAL | Status: DC

## 2020-02-09 MED ORDER — SODIUM CHLORIDE 0.9 % IV SOLN
1.0000 g | Freq: Once | INTRAVENOUS | Status: AC
  Administered 2020-02-09: 1 g via INTRAVENOUS
  Filled 2020-02-09: qty 10

## 2020-02-09 MED ORDER — PHENAZOPYRIDINE HCL 100 MG PO TABS: 100.0000 mg | ORAL_TABLET | Freq: Three times a day (TID) | ORAL | 0 refills | Status: AC | PRN

## 2020-02-09 MED ORDER — CEPHALEXIN 500 MG PO CAPS: 500.0000 mg | ORAL_CAPSULE | Freq: Three times a day (TID) | ORAL | 0 refills | Status: AC

## 2020-02-09 NOTE — ED Provider Notes (Signed)
[facility] Emergency Department Provider Note  ____________________________________________   First MD Initiated Contact with Patient 02/09/20 [phone]     (approximate)  I have reviewed the triage vital signs and the nursing notes.   HISTORY  Chief Complaint Hematuria   HPI Dawn Shepherd is a 69 y.o. female presents to the ED with complaint of hematuria that started approximately 2 AM today.  Patient also reports a sensation of urgency.  Patient denies any previous history of kidney stones.  She denies any fever, chills or vomiting.  She is having some low back pain with this.       Past Medical History:  Diagnosis Date  . Hypertension   . Skin cancer (melanoma) ([place]) 2005    Patient Active Problem List   Diagnosis Date Noted  . Non-intractable vomiting   . Acute pancreatitis 04/10/2019  . Hypercalcemia 04/10/2019  . Diabetes mellitus without complication ([place]) 14/78/2956  . Abnormal EKG 02/13/2013  . Hyperlipidemia 02/13/2013  . Essential hypertension 02/13/2013  . Frequent headaches 02/13/2013    Past Surgical History:  Procedure Laterality Date  . APPENDECTOMY    . BREAST CYST EXCISION Right   . BREAST EXCISIONAL BIOPSY Left    benign  . SKIN SURGERY     right wrist skin cancer.   . TUBAL LIGATION      Prior to Admission medications   Medication Sig Start Date End Date Taking? Authorizing Provider  aspirin EC 81 MG tablet Take 81 mg by mouth daily.    [provider]  atorvastatin (LIPITOR) 80 MG tablet Take 80 mg by mouth daily. 10/19/17   [provider]  cephALEXin (KEFLEX) 500 MG capsule Take 1 capsule (500 mg total) by mouth 3 (three) times daily. 02/09/20   Johnn Hai, PA-C  lisinopril (ZESTRIL) 40 MG tablet Take 1 tablet (40 mg total) by mouth daily. 04/15/19   Lorella Nimrod, MD  magnesium hydroxide (MILK OF MAGNESIA) 400 MG/5ML suspension Take 30 mLs by mouth daily. 04/15/19   Lorella Nimrod, MD    metFORMIN (GLUCOPHAGE) 1000 MG tablet Take 1,000 mg by mouth 2 (two) times daily.     [provider]  ondansetron (ZOFRAN) 4 MG tablet Take 1 tablet (4 mg total) by mouth daily as needed for nausea or vomiting. 04/14/19 04/13/20  Lorella Nimrod, MD  oxyCODONE-acetaminophen (PERCOCET/ROXICET) 5-325 MG tablet Take 1 tablet by mouth every 4 (four) hours as needed for moderate pain. 04/14/19   Lorella Nimrod, MD  phenazopyridine (PYRIDIUM) 100 MG tablet Take 1 tablet (100 mg total) by mouth 3 (three) times daily as needed for pain. 02/09/20 02/08/21  Johnn Hai, PA-C  senna-docusate (SENOKOT-S) 8.6-50 MG tablet Take 1 tablet by mouth at bedtime as needed for mild constipation. 04/14/19   Lorella Nimrod, MD  simethicone (MYLICON) 80 MG chewable tablet Chew 2 tablets (160 mg total) by mouth 4 (four) times daily as needed for flatulence. 04/14/19   Lorella Nimrod, MD    Allergies Patient has no known allergies.  Family History  Problem Relation Age of Onset  . Heart attack Mother 66  . Heart attack Father 80  . Breast cancer Neg Hx     Social History Social History   Tobacco Use  . Smoking status: Former Smoker    Packs/day: 1.00    Years: 20.00    Pack years: 20.00    Types: Cigarettes  . Smokeless tobacco: Never Used  Substance Use Topics  . Alcohol  use: No  . Drug use: No    Review of Systems Constitutional: No fever/chills Eyes: No visual changes. Cardiovascular: Denies chest pain. Respiratory: Denies shortness of breath. Gastrointestinal: No abdominal pain.  Mild nausea, no vomiting.  No diarrhea.  No constipation. Genitourinary: Positive for urinary frequency and hematuria. Musculoskeletal: Positive for low back pain. Skin: Negative for rash. Neurological: Negative for headaches, focal weakness or numbness. ____________________________________________   PHYSICAL EXAM:  VITAL SIGNS: ED Triage Vitals  Enc Vitals Group     BP 02/09/20 0354 (!) 157/105      Pulse Rate 02/09/20 0354 88     Resp 02/09/20 0354 20     Temp 02/09/20 0354 97.7 F (36.5 C)     Temp Source 02/09/20 0354 Oral     SpO2 02/09/20 0354 100 %     Weight 02/09/20 0355 167 lb (75.8 kg)     Height 02/09/20 0355 5\' 4"  (1.626 m)     Head Circumference --      Peak Flow --      Pain Score --      Pain Loc --      Pain Edu? --      Excl. in [place]? --     Constitutional: Alert and oriented. Well appearing and in no acute distress. Eyes: Conjunctivae are normal. PERRL. EOMI. Head: Atraumatic. Neck: No stridor.   Cardiovascular: Normal rate, regular rhythm. Grossly normal heart sounds.  Good peripheral circulation. Respiratory: Normal respiratory effort.  No retractions. Lungs CTAB. Gastrointestinal: Soft and nontender. No distention. No CVA tenderness. Musculoskeletal: On examination of the back there is no point tenderness on palpation of the thoracic or lumbar spine.  Patient's pain is over her sacral area.  Range of motion is without restriction.  Patient is able to stand and ambulate without any assistance. Neurologic:  Normal speech and language. No gross focal neurologic deficits are appreciated.  Skin:  Skin is warm, dry and intact.  Psychiatric: Mood and affect are normal. Speech and behavior are normal.  ____________________________________________   LABS (all labs ordered are listed, but only abnormal results are displayed)  Labs Reviewed  CBC - Abnormal; Notable for the following components:      Result Value   WBC 13.9 (*)    All other components within normal limits  COMPREHENSIVE METABOLIC PANEL - Abnormal; Notable for the following components:   Glucose, Bld 223 (*)    BUN 29 (*)    Creatinine, Ser 1.22 (*)    GFR, Estimated 45 (*)    All other components within normal limits  URINALYSIS, COMPLETE (UACMP) WITH MICROSCOPIC - Abnormal; Notable for the following components:   Color, Urine RED (*)    APPearance TURBID (*)    Glucose, UA   (*)    Value:  TEST NOT REPORTED DUE TO COLOR INTERFERENCE OF URINE PIGMENT   Hgb urine dipstick   (*)    Value: TEST NOT REPORTED DUE TO COLOR INTERFERENCE OF URINE PIGMENT   Bilirubin Urine   (*)    Value: TEST NOT REPORTED DUE TO COLOR INTERFERENCE OF URINE PIGMENT   Ketones, ur   (*)    Value: TEST NOT REPORTED DUE TO COLOR INTERFERENCE OF URINE PIGMENT   Protein, ur   (*)    Value: TEST NOT REPORTED DUE TO COLOR INTERFERENCE OF URINE PIGMENT   Nitrite   (*)    Value: TEST NOT REPORTED DUE TO COLOR INTERFERENCE OF URINE PIGMENT   Leukocytes,Ua   (*)  Value: TEST NOT REPORTED DUE TO COLOR INTERFERENCE OF URINE PIGMENT   RBC / HPF >50 (*)    All other components within normal limits    RADIOLOGY I, Johnn Hai, personally viewed and evaluated these images (plain radiographs) as part of my medical decision making, as well as reviewing the written report by the radiologist.   Official radiology report(s): CT Renal Stone Study  Result Date: 02/09/2020 CLINICAL DATA:  Hematuria beginning last night. EXAM: CT ABDOMEN AND PELVIS WITHOUT CONTRAST TECHNIQUE: Multidetector CT imaging of the abdomen and pelvis was performed following the standard protocol without IV contrast. COMPARISON:  04/10/2019 FINDINGS: Lower chest: Chronic scarring with calcification at the left lung base. No evidence of active cardiopulmonary disease. Hepatobiliary: Liver parenchyma is normal. 9 mm gallstone again shown in the gallbladder neck without CT evidence of cholecystitis or obstruction. Pancreas: Normal Spleen: Normal Adrenals/Urinary Tract: Adrenal glands are normal. Kidneys are normal. No cyst, mass, stone or hydronephrosis. No visible bladder abnormality. Stomach/Bowel: Stomach and small intestine are normal. Previous appendectomy. Moderate amount of fecal matter in the colon without evidence of obstructing lesion. No diverticulitis. Vascular/Lymphatic: Aortic atherosclerosis. No aneurysm. IVC is normal. No  retroperitoneal adenopathy. Reproductive: No pelvic mass. Other: No free fluid or air. Musculoskeletal: Mild lower lumbar degenerative changes. IMPRESSION: 1. No cause of hematuria identified. No evidence of urinary tract stone disease or other urinary tract pathology. 2. 9 mm gallstone again shown in the gallbladder neck without CT evidence of cholecystitis or obstruction. 3. Aortic atherosclerosis. Aortic Atherosclerosis (ICD10-I70.0). Electronically Signed   By: Nelson Chimes M.D.   On: 02/09/2020 10:33    ____________________________________________   PROCEDURES  Procedure(s) performed (including Critical Care):  Procedures   ____________________________________________   INITIAL IMPRESSION / ASSESSMENT AND PLAN / ED COURSE  As part of my medical decision making, I reviewed the following data within the electronic MEDICAL RECORD NUMBER Notes from prior ED visits and [place] Controlled Substance Database  69 year old female presents to the ED with sudden onset of hematuria and urinary frequency that started at 2 AM today.  Denies any vomiting but has experienced some mild nausea.  She is unaware of any kidney stones.  She denies any fever or chills.  There is no CVA tenderness.  While waiting for her CT scan 1 g of Rocephin was given IV and prior to discharge patient was feeling much better.  A prescription for Keflex was sent to her pharmacy along with Pyridium for bladder spasms.  Patient will follow up with her primary care provider if any continued problems.  She also is aware that she should return to the emergency department should she develop any fever, chills, or vomiting.  ____________________________________________   FINAL CLINICAL IMPRESSION(S) / ED DIAGNOSES  Final diagnoses:  Hemorrhagic cystitis     ED Discharge Orders         Ordered    cephALEXin (KEFLEX) 500 MG capsule  3 times daily        02/09/20 1146    phenazopyridine (PYRIDIUM) 100 MG tablet  3 times daily PRN         02/09/20 1146          *Please note:  Dawn Shepherd was evaluated in Emergency Department on 02/09/2020 for the symptoms described in the history of present illness. She was evaluated in the context of the global COVID-19 pandemic, which necessitated consideration that the patient might be at risk for infection with the SARS-CoV-2 virus that causes COVID-19.  Institutional protocols and algorithms that pertain to the evaluation of patients at risk for COVID-19 are in a state of rapid change based on information released by regulatory bodies including the CDC and federal and state organizations. These policies and algorithms were followed during the patient's care in the ED.  Some ED evaluations and interventions may be delayed as a result of limited staffing during and the pandemic.*   Note:  This document was prepared using Dragon voice recognition software and may include unintentional dictation errors.    Johnn Hai, PA-C 02/09/20 1324    Lucrezia Starch, MD 02/09/20 1416

## 2020-02-09 NOTE — ED Triage Notes (Signed)
Patient to ED for hematuria that started at 0200 this morning. Patient states lots of urgency sensation. States she has the feeling she "could pee a river every 5 seconds."

## 2020-02-09 NOTE — ED Notes (Signed)
See triage - pt a/o, moe x 4. NAD

## 2020-02-09 NOTE — Discharge Instructions (Addendum)
Follow-up with your primary care provider if any continued problems or concerns.  Increase fluids.  You may also take Tylenol or ibuprofen as needed for discomfort.  A prescription for antibiotics and a prescription for Pyridium was sent to your pharmacy.  The Pyridium is to help with bladder spasms to decrease the frequency but also contains a medication that will help with pain and burning.  This medication causes the urine to discolor and you may see this as a neon yellow to orange color which is caused by the medication.  Return to the emergency department if any severe worsening of your symptoms such as fever, chills, nausea or vomiting.

## 2020-07-01 ENCOUNTER — Other Ambulatory Visit: Payer: Self-pay | Admitting: Family Medicine

## 2020-07-01 DIAGNOSIS — Z1231 Encounter for screening mammogram for malignant neoplasm of breast: Secondary | ICD-10-CM

## 2020-08-12 ENCOUNTER — Ambulatory Visit
Admission: RE | Admit: 2020-08-12 | Discharge: 2020-08-12 | Disposition: A | Discharge status: Home / Self Care | Payer: Medicare HMO | Source: Ambulatory Visit | Attending: Family Medicine | Admitting: Family Medicine

## 2020-08-12 ENCOUNTER — Other Ambulatory Visit: Payer: Self-pay

## 2020-08-12 DIAGNOSIS — Z1231 Encounter for screening mammogram for malignant neoplasm of breast: Secondary | ICD-10-CM | POA: Insufficient documentation

## 2021-03-21 ENCOUNTER — Emergency Department
Admission: EM | Admit: 2021-03-21 | Discharge: 2021-03-21 | Disposition: A | Discharge status: Home / Self Care | Payer: Medicare HMO | Attending: Emergency Medicine | Admitting: Emergency Medicine

## 2021-03-21 ENCOUNTER — Emergency Department: Payer: Medicare HMO

## 2021-03-21 ENCOUNTER — Other Ambulatory Visit: Payer: Self-pay

## 2021-03-21 DIAGNOSIS — E785 Hyperlipidemia, unspecified: Secondary | ICD-10-CM | POA: Insufficient documentation

## 2021-03-21 DIAGNOSIS — E1169 Type 2 diabetes mellitus with other specified complication: Secondary | ICD-10-CM | POA: Diagnosis not present

## 2021-03-21 DIAGNOSIS — Z7984 Long term (current) use of oral hypoglycemic drugs: Secondary | ICD-10-CM | POA: Insufficient documentation

## 2021-03-21 DIAGNOSIS — I10 Essential (primary) hypertension: Secondary | ICD-10-CM | POA: Diagnosis not present

## 2021-03-21 DIAGNOSIS — Z87891 Personal history of nicotine dependence: Secondary | ICD-10-CM | POA: Diagnosis not present

## 2021-03-21 DIAGNOSIS — R0789 Other chest pain: Secondary | ICD-10-CM | POA: Diagnosis not present

## 2021-03-21 DIAGNOSIS — Z7982 Long term (current) use of aspirin: Secondary | ICD-10-CM | POA: Diagnosis not present

## 2021-03-21 DIAGNOSIS — Z79899 Other long term (current) drug therapy: Secondary | ICD-10-CM | POA: Diagnosis not present

## 2021-03-21 DIAGNOSIS — Z85820 Personal history of malignant melanoma of skin: Secondary | ICD-10-CM | POA: Insufficient documentation

## 2021-03-21 LAB — BASIC METABOLIC PANEL
Anion gap: 7 (ref 5–15)
BUN: 13 mg/dL (ref 8–23)
CO2: 26 mmol/L (ref 22–32)
Calcium: 9.9 mg/dL (ref 8.9–10.3)
Chloride: 101 mmol/L (ref 98–111)
Creatinine, Ser: 0.89 mg/dL (ref 0.44–1.00)
GFR, Estimated: >60 mL/min (ref >60)
Glucose, Bld: 160 mg/dL — ABNORMAL HIGH (ref 70–99)
Potassium: 4.2 mmol/L (ref 3.5–5.1)
Sodium: 134 mmol/L — ABNORMAL LOW (ref 135–145)

## 2021-03-21 LAB — CBC WITH DIFFERENTIAL/PLATELET
Abs Immature Granulocytes: 0.04 10*3/uL (ref 0.00–0.07)
Basophils Absolute: 0.1 10*3/uL (ref 0.0–0.1)
Basophils Relative: 1 %
Eosinophils Absolute: 0.1 10*3/uL (ref 0.0–0.5)
Eosinophils Relative: 1 %
HCT: 41.5 % (ref 36.0–46.0)
Hemoglobin: 13.8 g/dL (ref 12.0–15.0)
Immature Granulocytes: 0 %
Lymphocytes Relative: 22 %
Lymphs Abs: 2.2 10*3/uL (ref 0.7–4.0)
MCH: 30.1 pg (ref 26.0–34.0)
MCHC: 33.3 g/dL (ref 30.0–36.0)
MCV: 90.6 fL (ref 80.0–100.0)
Monocytes Absolute: 0.6 10*3/uL (ref 0.1–1.0)
Monocytes Relative: 6 %
Neutro Abs: 6.7 10*3/uL (ref 1.7–7.7)
Neutrophils Relative %: 70 %
Platelets: 355 10*3/uL (ref 150–400)
RBC: 4.58 MIL/uL (ref 3.87–5.11)
RDW: 12.7 % (ref 11.5–15.5)
WBC: 9.7 10*3/uL (ref 4.0–10.5)
nRBC: 0 % (ref 0.0–0.2)

## 2021-03-21 LAB — TROPONIN I (HIGH SENSITIVITY): Troponin I (High Sensitivity): 5 ng/L (ref <18)

## 2021-03-21 NOTE — Discharge Instructions (Signed)
Your test today were all normal.  Try taking anti-inflammatory medicine and using a gentle heating pad to relieve the pain.

## 2021-03-21 NOTE — ED Triage Notes (Signed)
Pt comes with c/o mid sternal CP that started yesterday. Pt states right arm pain and now numbness. Pt states some SOB and felt like heaviness on chest yesterday. Pt states 5/10 pain at this time.

## 2021-03-21 NOTE — ED Provider Notes (Signed)
[facility] Emergency Department Provider Note  ____________________________________________  Time seen: Approximately 2:03 PM  I have reviewed the triage vital signs and the nursing notes.   HISTORY  Chief Complaint Chest Pain    HPI Dawn Shepherd is a 70 y.o. female with a history of hypertension, diabetes, hyperlipidemia who comes ED complaining of anterior chest pain that feels like tightness that is been going on for 24 hours.  Waxing and waning, constant, worse with palpation to the area.  Not exertional, not pleuritic.  No shortness of breath diaphoresis or vomiting.  Nonradiating.  Currently mild intensity.  Denies any recent exertional symptoms.  No trauma or recent illness.    Past Medical History:  Diagnosis Date   Hypertension    Skin cancer (melanoma) ([place]) 2005     Patient Active Problem List   Diagnosis Date Noted   Non-intractable vomiting    Acute pancreatitis 04/10/2019   Hypercalcemia 04/10/2019   Diabetes mellitus without complication ([place]) 26/94/8546   Abnormal EKG 02/13/2013   Hyperlipidemia 02/13/2013   Essential hypertension 02/13/2013   Frequent headaches 02/13/2013     Past Surgical History:  Procedure Laterality Date   APPENDECTOMY     BREAST CYST EXCISION Right    BREAST EXCISIONAL BIOPSY Left    benign   SKIN SURGERY     right wrist skin cancer.    TUBAL LIGATION       Prior to Admission medications   Medication Sig Start Date End Date Taking? Authorizing Provider  aspirin EC 81 MG tablet Take 81 mg by mouth daily.    [provider]  atorvastatin (LIPITOR) 80 MG tablet Take 80 mg by mouth daily. 10/19/17   [provider]  cephALEXin (KEFLEX) 500 MG capsule Take 1 capsule (500 mg total) by mouth 3 (three) times daily. 02/09/20   Johnn Hai, PA-C  lisinopril (ZESTRIL) 40 MG tablet Take 1 tablet (40 mg total) by mouth daily. 04/15/19   Lorella Nimrod, MD  magnesium hydroxide (MILK OF  MAGNESIA) 400 MG/5ML suspension Take 30 mLs by mouth daily. 04/15/19   Lorella Nimrod, MD  metFORMIN (GLUCOPHAGE) 1000 MG tablet Take 1,000 mg by mouth 2 (two) times daily.     [provider]  oxyCODONE-acetaminophen (PERCOCET/ROXICET) 5-325 MG tablet Take 1 tablet by mouth every 4 (four) hours as needed for moderate pain. 04/14/19   Lorella Nimrod, MD  senna-docusate (SENOKOT-S) 8.6-50 MG tablet Take 1 tablet by mouth at bedtime as needed for mild constipation. 04/14/19   Lorella Nimrod, MD  simethicone (MYLICON) 80 MG chewable tablet Chew 2 tablets (160 mg total) by mouth 4 (four) times daily as needed for flatulence. 04/14/19   Lorella Nimrod, MD     Allergies Patient has no known allergies.   Family History  Problem Relation Age of Onset   Heart attack Mother 37   Heart attack Father 60   Breast cancer Neg Hx     Social History Social History   Tobacco Use   Smoking status: Former    Packs/day: 1.00    Years: 20.00    Pack years: 20.00    Types: Cigarettes   Smokeless tobacco: Never  Substance Use Topics   Alcohol use: No   Drug use: No    Review of Systems  Constitutional:   No fever or chills.  ENT:   No sore throat. No rhinorrhea. Cardiovascular:   Positive chest pain as above without syncope. Respiratory:   No dyspnea  or cough. Gastrointestinal:   Negative for abdominal pain, vomiting and diarrhea.  Musculoskeletal:   Negative for focal pain or swelling All other systems reviewed and are negative except as documented above in ROS and HPI.  ____________________________________________   PHYSICAL EXAM:  VITAL SIGNS: ED Triage Vitals  Enc Vitals Group     BP 03/21/21 0859 (!) 175/86     Pulse Rate 03/21/21 0859 85     Resp 03/21/21 0859 18     Temp 03/21/21 0859 98 F (36.7 C)     Temp Source 03/21/21 0859 Oral     SpO2 03/21/21 0859 99 %     Weight --      Height --      Head Circumference --      Peak Flow --      Pain Score 03/21/21 0852 5      Pain Loc --      Pain Edu? --      Excl. in [place]? --     Vital signs reviewed, nursing assessments reviewed.   Constitutional:   Alert and oriented. Non-toxic appearance. Eyes:   Conjunctivae are normal. EOMI. PERRL. ENT      Head:   Normocephalic and atraumatic.      Nose:   Wearing a mask.      Mouth/Throat:   Wearing a mask.      Neck:   No meningismus. Full ROM. Hematological/Lymphatic/Immunilogical:   No cervical lymphadenopathy. Cardiovascular:   RRR. Symmetric bilateral radial and DP pulses.  No murmurs. Cap refill less than 2 seconds. Respiratory:   Normal respiratory effort without tachypnea/retractions. Breath sounds are clear and equal bilaterally. No wheezes/rales/rhonchi. Gastrointestinal:   Soft and nontender. Non distended. There is no CVA tenderness.  No rebound, rigidity, or guarding. Genitourinary:   deferred Musculoskeletal:   Normal range of motion in all extremities. No joint effusions.  No lower extremity tenderness.  No edema.  Patient is able to demonstrate the pain is reproducible with palpation over the sternal costal margin Neurologic:   Normal speech and language.  Motor grossly intact. No acute focal neurologic deficits are appreciated.  Skin:    Skin is warm, dry and intact. No rash noted.  No petechiae, purpura, or bullae.  ____________________________________________    LABS (pertinent positives/negatives) (all labs ordered are listed, but only abnormal results are displayed) Labs Reviewed  BASIC METABOLIC PANEL - Abnormal; Notable for the following components:      Result Value   Sodium 134 (*)    Glucose, Bld 160 (*)    All other components within normal limits  CBC WITH DIFFERENTIAL/PLATELET  TROPONIN I (HIGH SENSITIVITY)   ____________________________________________   EKG  Interpreted by me Sinus rhythm rate of 70.  Normal axis and intervals.  Poor R wave progression.  Normal ST segments and T waves.  No ischemic  changes.  ____________________________________________    RADIOLOGY  DG Chest 2 View  Result Date: 03/21/2021 CLINICAL DATA:  Chest and RIGHT arm pain, midsternal chest pain since yesterday, RIGHT arm pain and numbness now, some shortness of breath and chest heaviness, history hypertension EXAM: CHEST - 2 VIEW COMPARISON:  11/08/2017 FINDINGS: Normal heart size, mediastinal contours, and pulmonary vascularity. Atherosclerotic calcification aorta. Minimal LEFT basilar scarring. Lungs otherwise clear. No pulmonary infiltrate, pleural effusion, or pneumothorax. Osseous structures demineralized. IMPRESSION: Minimal LEFT basilar scarring. No acute abnormalities. Electronically Signed   By: Lavonia Dana M.D.   On: 03/21/2021 13:10    ____________________________________________  PROCEDURES Procedures  ____________________________________________    CLINICAL IMPRESSION / ASSESSMENT AND PLAN / ED COURSE  Medications ordered in the ED: Medications - No data to display  Pertinent labs & imaging results that were available during my care of the patient were reviewed by me and considered in my medical decision making (see chart for details).  ILISA HAYWORTH was evaluated in Emergency Department on 03/21/2021 for the symptoms described in the history of present illness. She was evaluated in the context of the global COVID-19 pandemic, which necessitated consideration that the patient might be at risk for infection with the SARS-CoV-2 virus that causes COVID-19. Institutional protocols and algorithms that pertain to the evaluation of patients at risk for COVID-19 are in a state of rapid change based on information released by regulatory bodies including the CDC and federal and state organizations. These policies and algorithms were followed during the patient's care in the ED.   Patient presents with reproducible chest wall pain.  EKG chest x-ray and labs are all unremarkable.  Vital signs and exam  are reassuring.   Considering the patient's symptoms, medical history, and physical examination today, I have low suspicion for ACS, PE, TAD, pneumothorax, carditis, mediastinitis, pneumonia, CHF, or sepsis. Stable for discharge home, conservative measures with anti-inflammatory medication and heat therapy.      ____________________________________________   FINAL CLINICAL IMPRESSION(S) / ED DIAGNOSES    Final diagnoses:  Chest wall pain     ED Discharge Orders     None       Portions of this note were generated with dragon dictation software. Dictation errors may occur despite best attempts at proofreading.    Carrie Mew, MD 03/21/21 [phone]

## 2021-08-29 ENCOUNTER — Other Ambulatory Visit: Payer: Self-pay | Admitting: Family Medicine

## 2021-08-29 DIAGNOSIS — Z1231 Encounter for screening mammogram for malignant neoplasm of breast: Secondary | ICD-10-CM

## 2021-09-23 ENCOUNTER — Ambulatory Visit
Admission: RE | Admit: 2021-09-23 | Discharge: 2021-09-23 | Disposition: A | Discharge status: Home / Self Care | Payer: Medicare Other | Source: Ambulatory Visit | Attending: Family Medicine | Admitting: Family Medicine

## 2021-09-23 DIAGNOSIS — Z1231 Encounter for screening mammogram for malignant neoplasm of breast: Secondary | ICD-10-CM | POA: Diagnosis present

## 2021-12-05 ENCOUNTER — Encounter: Payer: Self-pay | Admitting: Intensive Care

## 2021-12-05 ENCOUNTER — Other Ambulatory Visit: Payer: Self-pay

## 2021-12-05 ENCOUNTER — Emergency Department
Admission: EM | Admit: 2021-12-05 | Discharge: 2021-12-05 | Disposition: A | Discharge status: Home / Self Care | Payer: Medicare Other | Attending: Emergency Medicine | Admitting: Emergency Medicine

## 2021-12-05 ENCOUNTER — Emergency Department: Payer: Medicare Other

## 2021-12-05 DIAGNOSIS — R110 Nausea: Secondary | ICD-10-CM | POA: Diagnosis not present

## 2021-12-05 DIAGNOSIS — R079 Chest pain, unspecified: Secondary | ICD-10-CM | POA: Diagnosis present

## 2021-12-05 DIAGNOSIS — R140 Abdominal distension (gaseous): Secondary | ICD-10-CM | POA: Diagnosis not present

## 2021-12-05 DIAGNOSIS — R0789 Other chest pain: Secondary | ICD-10-CM | POA: Diagnosis not present

## 2021-12-05 HISTORY — DX: Type 2 diabetes mellitus without complications: E11.9

## 2021-12-05 HISTORY — DX: Pure hypercholesterolemia, unspecified: E78.00

## 2021-12-05 LAB — CBC
HCT: 40.5 % (ref 36.0–46.0)
Hemoglobin: 13.6 g/dL (ref 12.0–15.0)
MCH: 29.6 pg (ref 26.0–34.0)
MCHC: 33.6 g/dL (ref 30.0–36.0)
MCV: 88.2 fL (ref 80.0–100.0)
Platelets: 331 10*3/uL (ref 150–400)
RBC: 4.59 MIL/uL (ref 3.87–5.11)
RDW: 12.5 % (ref 11.5–15.5)
WBC: 9.3 10*3/uL (ref 4.0–10.5)
nRBC: 0 % (ref 0.0–0.2)

## 2021-12-05 LAB — BASIC METABOLIC PANEL
Anion gap: 11 (ref 5–15)
BUN: 16 mg/dL (ref 8–23)
CO2: 24 mmol/L (ref 22–32)
Calcium: 10.2 mg/dL (ref 8.9–10.3)
Chloride: 102 mmol/L (ref 98–111)
Creatinine, Ser: 1.04 mg/dL — ABNORMAL HIGH (ref 0.44–1.00)
GFR, Estimated: 58 mL/min — ABNORMAL LOW (ref >60)
Glucose, Bld: 169 mg/dL — ABNORMAL HIGH (ref 70–99)
Potassium: 4.1 mmol/L (ref 3.5–5.1)
Sodium: 137 mmol/L (ref 135–145)

## 2021-12-05 LAB — TROPONIN I (HIGH SENSITIVITY)
Troponin I (High Sensitivity): 3 ng/L (ref <18)
Troponin I (High Sensitivity): <2 ng/L (ref <18)

## 2021-12-05 MED ORDER — ALUM & MAG HYDROXIDE-SIMETH 200-200-20 MG/5ML PO SUSP
30.0000 mL | Freq: Once | ORAL | Status: AC
  Administered 2021-12-05: 30 mL via ORAL
  Filled 2021-12-05: qty 30

## 2021-12-05 MED ORDER — LIDOCAINE VISCOUS HCL 2 % MT SOLN
15.0000 mL | Freq: Once | OROMUCOSAL | Status: AC
  Administered 2021-12-05: 15 mL via ORAL
  Filled 2021-12-05: qty 15

## 2021-12-05 NOTE — ED Provider Notes (Signed)
[facility] Provider Note    Event Date/Time   First MD Initiated Contact with Patient 12/05/21 [phone]     (approximate)   History   Chest Pain   HPI  ODALYS Shepherd is a 71 y.o. female who presents to the emergency department today because of concerns for chest pain.  The symptoms started at 730 this morning.  Patient had already been up.  She denies any unusual exertion prior to the pain starting.  She did have some nausea and feels like she has gas.  She denies any shortness of breath.  Denies similar symptoms in the past.  Had not eaten anything today.     Physical Exam   Triage Vital Signs: ED Triage Vitals [12/05/21 0836]  Enc Vitals Group     BP (!) 147/87     Pulse Rate 95     Resp 16     Temp 97.8 F (36.6 C)     Temp Source Oral     SpO2 100 %     Weight 149 lb (67.6 kg)     Height '5\' 4"'$  (1.626 m)     Head Circumference      Peak Flow      Pain Score 7     Pain Loc      Pain Edu?      Excl. in [place]?     Most recent vital signs: Vitals:   12/05/21 0836  BP: (!) 147/87  Pulse: 95  Resp: 16  Temp: 97.8 F (36.6 C)  SpO2: 100%    General: Awake, alert, oriented. CV:  Good peripheral perfusion. Regular rate and rhythm. Resp:  Normal effort. Lungs clear. Abd:  No distention. Non tender.   ED Results / Procedures / Treatments   Labs (all labs ordered are listed, but only abnormal results are displayed) Labs Reviewed  BASIC METABOLIC PANEL - Abnormal; Notable for the following components:      Result Value   Glucose, Bld 169 (*)    Creatinine, Ser 1.04 (*)    GFR, Estimated 58 (*)    All other components within normal limits  CBC  TROPONIN I (HIGH SENSITIVITY)  TROPONIN I (HIGH SENSITIVITY)     EKG  I, Nance Pear, attending physician, personally viewed and interpreted this EKG  EKG Time: 0839 Rate: 88 Rhythm: sinus rhythm Axis: normal Intervals: qtc 438 QRS: narrow ST changes: no st  elevation Impression: normal ekg    RADIOLOGY I independently interpreted and visualized the CxR. My interpretation: No pneumonia. No pneumothorax.  Radiology interpretation:  IMPRESSION:  Stable. Scarring in the left mid lung and left costophrenic angle.  No acute cardiopulmonary findings.     PROCEDURES:  Critical Care performed: No  Procedures   MEDICATIONS ORDERED IN ED: Medications - No data to display   IMPRESSION / MDM / [place] / ED COURSE  I reviewed the triage vital signs and the nursing notes.                              Differential diagnosis includes, but is not limited to, ACS, pneumonia, pneumothorax, esophagitis.  Patient's presentation is most consistent with acute presentation with potential threat to life or bodily function.  Patient presented to the emergency department today because of concerns for left-sided chest pain that started today.  On exam patient does have some tenderness to palpation.  Work-up here with  troponin negative x2.  X-ray without any pneumonia or pneumothorax.  Patient told myself that she did get some improvement after GI cocktail.  At this time I have low concern for PE or dissection.  Given reassuring work-up I think is reasonable for patient be discharged home.  Discussed importance of primary care follow-up.  FINAL CLINICAL IMPRESSION(S) / ED DIAGNOSES   Final diagnoses:  Nonspecific chest pain     Note:  This document was prepared using Dragon voice recognition software and may include unintentional dictation errors.    Nance Pear, MD 12/05/21 [phone]

## 2021-12-05 NOTE — ED Notes (Signed)
Assumed care pt c/o intermittent n/v x 7 weeks was seen by PCP. Got up this AM to run errands became progressively weak and with chest pain. Pt states 4/10 chest discomfort. Pt alert and oriented in NAD. IV place. Call bell within reach.

## 2021-12-05 NOTE — ED Triage Notes (Signed)
Patient c/o sharp, tight chest pain that started around 0730 am. Denies radiation. Reports SOB with pain

## 2021-12-05 NOTE — ED Notes (Signed)
Pt reports pressure in chest , MD aware. No other complaints .

## 2021-12-05 NOTE — Discharge Instructions (Signed)
Please seek medical attention for any high fevers, chest pain, shortness of breath, change in behavior, persistent vomiting, bloody stool or any other new or concerning symptoms.  

## 2022-06-20 ENCOUNTER — Inpatient Hospital Stay: Payer: 59

## 2022-06-20 ENCOUNTER — Encounter: Payer: Self-pay | Admitting: Internal Medicine

## 2022-06-20 ENCOUNTER — Inpatient Hospital Stay: Payer: 59 | Attending: Internal Medicine | Admitting: Internal Medicine

## 2022-06-20 VITALS — BP 124/94 | HR 79 | Temp 98.6°F | Resp 19 | Wt 152.1 lb

## 2022-06-20 DIAGNOSIS — Z7982 Long term (current) use of aspirin: Secondary | ICD-10-CM | POA: Diagnosis not present

## 2022-06-20 DIAGNOSIS — I10 Essential (primary) hypertension: Secondary | ICD-10-CM | POA: Diagnosis not present

## 2022-06-20 DIAGNOSIS — F17210 Nicotine dependence, cigarettes, uncomplicated: Secondary | ICD-10-CM

## 2022-06-20 DIAGNOSIS — E119 Type 2 diabetes mellitus without complications: Secondary | ICD-10-CM | POA: Diagnosis not present

## 2022-06-20 DIAGNOSIS — Z7984 Long term (current) use of oral hypoglycemic drugs: Secondary | ICD-10-CM | POA: Diagnosis not present

## 2022-06-20 DIAGNOSIS — Z794 Long term (current) use of insulin: Secondary | ICD-10-CM | POA: Insufficient documentation

## 2022-06-20 DIAGNOSIS — Z85820 Personal history of malignant melanoma of skin: Secondary | ICD-10-CM | POA: Diagnosis present

## 2022-06-20 DIAGNOSIS — Z79899 Other long term (current) drug therapy: Secondary | ICD-10-CM | POA: Insufficient documentation

## 2022-06-20 DIAGNOSIS — C439 Malignant melanoma of skin, unspecified: Secondary | ICD-10-CM | POA: Insufficient documentation

## 2022-06-20 DIAGNOSIS — E7800 Pure hypercholesterolemia, unspecified: Secondary | ICD-10-CM | POA: Insufficient documentation

## 2022-06-20 DIAGNOSIS — C4362 Malignant melanoma of left upper limb, including shoulder: Secondary | ICD-10-CM

## 2022-06-20 NOTE — Progress Notes (Signed)
[facility] CONSULT NOTE  Patient Care Team: Dion Body, MD as PCP - General (Family Medicine)  REFERRING PROVIDER: Dr. Richarda Overlie  REASON FOR REFFERAL: History of left arm malignant melanoma  CANCER STAGING   Cancer Staging  Malignant melanoma ([place]) Staging form: Melanoma of the Skin, AJCC 8th Edition - Clinical: Stage IV (pM1a) - Signed by Jane Canary, MD on 06/20/2022   ASSESSMENT & PLAN:  Dawn Shepherd 72 y.o. female with pmh of diabetes, hypertension, hyperlipidemia was referred to medical oncology for monitoring of recurrent skin malignant melanoma as a transfer of care from [facility] due to change in insurance.  # Recurrent left arm melanoma -Status post shave biopsy in August 2020, size 1.5 cm.  BRAF V600 E mutation negative.  Followed by wide local excision with path negative for residual melanoma.  There was also concern for involvement of lung and she had left lower lobe wedge resection however pathology came back negative for malignancy.  Patient also received 2 cycles of pembrolizumab preoperatively.  -She has been on surveillance imaging every 6 months postsurgery.  Last scan was in August 2023 with NED.  Ordered CT chest abdomen and pelvis for monitoring.  Will continue with monitoring every 6 months until November 2025 which will be completion of 5 years.  -Patient has also changed her dermatologist due to insurance issues and is planning to see her in May.  # History of right arm melanoma in 2005 -Had resection at [facility].  No records available.  # Hypertension-stable # Hyperlipidemia-stable # Diabetes-stable   Orders Placed This Encounter  Procedures   CT CHEST ABDOMEN PELVIS W CONTRAST    Standing Status:   Future    Standing Expiration Date:   06/21/2023    Scheduling Instructions:     In 2 weeks    Order Specific Question:   Preferred imaging location?    Answer:   [facility]    Order Specific Question:   Radiology Contrast  Protocol - do NOT remove file path    Answer:   [email or web address]    The total time spent in the appointment was 60 minutes encounter with patients including review of chart and various tests results, discussions about plan of care and coordination of care plan   All questions were answered. The patient knows to call the clinic with any problems, questions or concerns. No barriers to learning was detected.  Jane Canary, MD 2/27/20244:00 PM   HISTORY OF PRESENTING ILLNESS:  Dawn Shepherd 72 y.o. female with pmh of diabetes, hypertension, hyperlipidemia was referred to medical oncology for monitoring of recurrent skin malignant melanoma as a transfer of care from [facility] due to change in insurance.  I have reviewed her chart and materials related to her cancer extensively and collaborated history with the patient. Summary of oncologic history is as follows: Oncology History Overview Note  Information obtained from [facility] where she had been treated so far.  History of right arm melanoma status post excision in 2005.  Then had left arm melanoma in August 2020.  Treated with 2 doses of neoadjuvant pembrolizumab status post excision in November 2020.   Malignant melanoma ([place])  11/2018 Pathology Results   11/2018 Subcutaneous recurrence on the left arm- Stage IV M1a A: Skin, Left upper arm, shave - Metastatic malignant melanoma Size: 1.5 cm Pattern: Dermal nodule with sheets of non-pigmented, pleomorphic epithelioid cells with clear cell change and focal necrosis (10%) Tumor infiltrating lymphocytes: Brisk Margins: Present in base of  specimen  - Tumor negative for BRAF V600E mutation by VE1 immunostain    11/2018 PET scan   12/19/2018 PET - Small subcutaneous hypermetabolic nodule along the left upper arm, likely corresponding to known biopsy-proven melanoma.  - Small left anterolateral neck cutaneous hypermetabolic nodule, which may reflect the site of  recently biopsied epidermal inclusion cyst, though additional malignant site is not excluded. Recommend correlation with clinical exam.--correlated   MRI brain negative for mets    02/01/2019 Imaging   The pt went to the ED w acute onset of lower back pain and while she was there a CT scan was done. The CT scan showed a few new/enlarging right lung nodules measuring up to 0.6 cm compared to 12/19/2018    03/05/2019 Surgery   03/05/19 Upper left arm, excision - Negative for residual melanoma. - Sparse dermal fibrosis, inflammation, hemosiderin. - Dermal nevus, margins free.   A: Lung, left lower lobe, wedge resection - Lung tissue with multifocal non necrotizing well formed granulomas. See Note - Negative for malignancy  B: Lung, left lower lobe, wedge resection - Lung tissue with confluent non necrotizing well formed granulomas. See Note - Negative for malignancy  Note: AFB and fungal (GMS) stains are negative or organisms, No evidence of melanoma (SOX-10 stain reviewed)     Imaging       MEDICAL HISTORY:  Past Medical History:  Diagnosis Date   Diabetes mellitus without complication ([place])    High cholesterol    Hypertension    Skin cancer (melanoma) ([place]) 2005    SURGICAL HISTORY: Past Surgical History:  Procedure Laterality Date   APPENDECTOMY     BREAST CYST EXCISION Right    BREAST EXCISIONAL BIOPSY Left    benign   SKIN SURGERY     right wrist skin cancer.    TUBAL LIGATION      SOCIAL HISTORY: Social History   Socioeconomic History   Marital status: Widowed    Spouse name: Not on file   Number of children: Not on file   Years of education: Not on file   Highest education level: Not on file  Occupational History   Not on file  Tobacco Use   Smoking status: Every Day    Packs/day: 1.00    Years: 20.00    Total pack years: 20.00    Types: Cigarettes   Smokeless tobacco: Never  Vaping Use   Vaping Use: Never used  Substance and Sexual Activity    Alcohol use: No   Drug use: No   Sexual activity: Yes    Birth control/protection: None  Other Topics Concern   Not on file  Social History Narrative   Not on file   Social Determinants of Health   Financial Resource Strain: Not on file  Food Insecurity: Not on file  Transportation Needs: Not on file  Physical Activity: Not on file  Stress: Not on file  Social Connections: Not on file  Intimate Partner Violence: Not on file    FAMILY HISTORY: Family History  Problem Relation Age of Onset   Heart attack Mother 52   Heart attack Father 46   Breast cancer Neg Hx     ALLERGIES:  has No Known Allergies.  MEDICATIONS:  Current Outpatient Medications  Medication Sig Dispense Refill   aspirin EC 81 MG tablet Take 81 mg by mouth daily.     atorvastatin (LIPITOR) 80 MG tablet Take 80 mg by mouth daily.  1   insulin lispro (  HUMALOG KWIKPEN) 100 UNIT/ML KwikPen Inject into the skin.     lisinopril (ZESTRIL) 40 MG tablet Take 1 tablet (40 mg total) by mouth daily. 30 tablet 1   metFORMIN (GLUCOPHAGE) 1000 MG tablet Take 1,000 mg by mouth 2 (two) times daily.      Semaglutide, 1 MG/DOSE, (OZEMPIC, 1 MG/DOSE,) 4 MG/3ML SOPN INJECT SUBCUTANEOUSLY 1 MG EVERY WEEK     cephALEXin (KEFLEX) 500 MG capsule Take 1 capsule (500 mg total) by mouth 3 (three) times daily. (Patient not taking: Reported on 06/20/2022) 30 capsule 0   magnesium hydroxide (MILK OF MAGNESIA) 400 MG/5ML suspension Take 30 mLs by mouth daily. (Patient not taking: Reported on 06/20/2022) 355 mL 0   oxyCODONE-acetaminophen (PERCOCET/ROXICET) 5-325 MG tablet Take 1 tablet by mouth every 4 (four) hours as needed for moderate pain. (Patient not taking: Reported on 06/20/2022) 30 tablet 0   senna-docusate (SENOKOT-S) 8.6-50 MG tablet Take 1 tablet by mouth at bedtime as needed for mild constipation. (Patient not taking: Reported on 06/20/2022) 30 tablet 0   simethicone (MYLICON) 80 MG chewable tablet Chew 2 tablets (160 mg total) by  mouth 4 (four) times daily as needed for flatulence. (Patient not taking: Reported on 06/20/2022) 30 tablet 0   No current facility-administered medications for this visit.    REVIEW OF SYSTEMS:   Pertinent information mentioned in HPI All other systems were reviewed with the patient and are negative.  PHYSICAL EXAMINATION: ECOG PERFORMANCE STATUS: 0 - Asymptomatic  Vitals:   06/20/22 1503  BP: (!) 124/94  Pulse: 79  Resp: 19  Temp: 98.6 F (37 C)  SpO2: 99%   Filed Weights   06/20/22 1503  Weight: 152 lb 1.6 oz (69 kg)    GENERAL:alert, no distress and comfortable SKIN: skin color, texture, turgor are normal, no rashes or significant lesions EYES: normal, conjunctiva are pink and non-injected, sclera clear OROPHARYNX:no exudate, no erythema and lips, buccal mucosa, and tongue normal  NECK: supple, thyroid normal size, non-tender, without nodularity LYMPH:  no palpable lymphadenopathy in the cervical, axillary or inguinal LUNGS: clear to auscultation and percussion with normal breathing effort HEART: regular rate & rhythm and no murmurs and no lower extremity edema ABDOMEN:abdomen soft, non-tender and normal bowel sounds Musculoskeletal:no cyanosis of digits and no clubbing  PSYCH: alert & oriented x 3 with fluent speech NEURO: no focal motor/sensory deficits  LABORATORY DATA:  I have reviewed the data as listed Lab Results  Component Value Date   WBC 9.3 12/05/2021   HGB 13.6 12/05/2021   HCT 40.5 12/05/2021   MCV 88.2 12/05/2021   PLT 331 12/05/2021   Recent Labs    12/05/21 0909  NA 137  K 4.1  CL 102  CO2 24  GLUCOSE 169*  BUN 16  CREATININE 1.04*  CALCIUM 10.2  GFRNONAA 58*    RADIOGRAPHIC STUDIES: I have personally reviewed the radiological images as listed and agreed with the findings in the report. No results found.

## 2022-07-05 ENCOUNTER — Ambulatory Visit: Admission: RE | Admit: 2022-07-05 | Payer: 59 | Source: Ambulatory Visit

## 2022-07-10 ENCOUNTER — Telehealth: Payer: Self-pay | Admitting: Internal Medicine

## 2022-07-10 NOTE — Telephone Encounter (Signed)
pt called and lvm to be contacted in regards to appt. Attempt made to pt lvm

## 2022-07-13 ENCOUNTER — Inpatient Hospital Stay: Payer: 59 | Admitting: Internal Medicine

## 2022-07-13 ENCOUNTER — Ambulatory Visit
Admission: RE | Admit: 2022-07-13 | Discharge: 2022-07-13 | Disposition: A | Discharge status: Home / Self Care | Payer: 59 | Source: Ambulatory Visit | Attending: Internal Medicine | Admitting: Internal Medicine

## 2022-07-13 DIAGNOSIS — C4362 Malignant melanoma of left upper limb, including shoulder: Secondary | ICD-10-CM | POA: Diagnosis not present

## 2022-07-13 LAB — POCT I-STAT CREATININE: Creatinine, Ser: 1 mg/dL (ref 0.44–1.00)

## 2022-07-13 MED ORDER — IOHEXOL 300 MG/ML  SOLN
100.0000 mL | Freq: Once | INTRAMUSCULAR | Status: AC | PRN
  Administered 2022-07-13: 100 mL via INTRAVENOUS

## 2022-07-20 ENCOUNTER — Encounter: Payer: Self-pay | Admitting: Internal Medicine

## 2022-07-20 ENCOUNTER — Inpatient Hospital Stay: Payer: 59 | Attending: Internal Medicine | Admitting: Internal Medicine

## 2022-07-20 DIAGNOSIS — R9389 Abnormal findings on diagnostic imaging of other specified body structures: Secondary | ICD-10-CM | POA: Diagnosis not present

## 2022-07-20 DIAGNOSIS — Z79899 Other long term (current) drug therapy: Secondary | ICD-10-CM | POA: Insufficient documentation

## 2022-07-20 DIAGNOSIS — Z794 Long term (current) use of insulin: Secondary | ICD-10-CM | POA: Diagnosis not present

## 2022-07-20 DIAGNOSIS — Z7984 Long term (current) use of oral hypoglycemic drugs: Secondary | ICD-10-CM | POA: Insufficient documentation

## 2022-07-20 DIAGNOSIS — Z7982 Long term (current) use of aspirin: Secondary | ICD-10-CM | POA: Diagnosis not present

## 2022-07-20 DIAGNOSIS — I10 Essential (primary) hypertension: Secondary | ICD-10-CM | POA: Insufficient documentation

## 2022-07-20 DIAGNOSIS — E119 Type 2 diabetes mellitus without complications: Secondary | ICD-10-CM | POA: Insufficient documentation

## 2022-07-20 DIAGNOSIS — C4362 Malignant melanoma of left upper limb, including shoulder: Secondary | ICD-10-CM | POA: Diagnosis not present

## 2022-07-20 DIAGNOSIS — E7800 Pure hypercholesterolemia, unspecified: Secondary | ICD-10-CM | POA: Diagnosis not present

## 2022-07-20 DIAGNOSIS — Z85820 Personal history of malignant melanoma of skin: Secondary | ICD-10-CM | POA: Diagnosis present

## 2022-07-20 DIAGNOSIS — F17210 Nicotine dependence, cigarettes, uncomplicated: Secondary | ICD-10-CM | POA: Diagnosis not present

## 2022-07-21 NOTE — Progress Notes (Signed)
[facility] CONSULT NOTE  I connected with ARNECIA COLONE on 07/21/22 at  3:45 PM EDT by my phone and verified that I am speaking with the correct person using two identifiers.   I discussed the limitations, risks, security and privacy concerns of performing an evaluation and management service by telemedicine and the availability of in-person appointments. I also discussed with the patient that there may be a patient responsible charge related to this service. The patient expressed understanding and agreed to proceed.   Other persons participating in the visit and their role in the encounter: None  Patient's location: Home Provider's location: Clinic  Chief Complaint: Discussed CT scan  Time spent in discussing about medical problems was 20 minutes  Patient Care Team: Dion Body, MD as PCP - General (Family Medicine)  REFERRING PROVIDER: Dr. Richarda Overlie  REASON FOR REFFERAL: History of left arm malignant melanoma  CANCER STAGING   Cancer Staging  Malignant melanoma ([place]) Staging form: Melanoma of the Skin, AJCC 8th Edition - Clinical: Stage IV (pM1a) - Signed by Jane Canary, MD on 06/20/2022   ASSESSMENT & PLAN:  Dawn Shepherd 72 y.o. female with pmh of diabetes, hypertension, hyperlipidemia was referred to medical oncology for monitoring of recurrent skin malignant melanoma as a transfer of care from [facility] due to change in insurance.  # Recurrent left arm melanoma -Status post shave biopsy in August 2020, size 1.5 cm.  BRAF V600 E mutation negative.  Followed by wide local excision with path negative for residual melanoma.  There was also concern for involvement of lung and she had left lower lobe wedge resection however pathology came back negative for malignancy.  Patient also received 2 cycles of pembrolizumab preoperatively.  -She has been on surveillance imaging every 6 months postsurgery.  Last scan was in August 2023 with NED.  -CT chest abdomen  and pelvis was reviewed with the patient.  There is no evidence of metastatic disease. There is ill-defined diffuse subtle groundglass nodularity with a peripheral predominance in bilateral lung.  Nonspecific but favored to reflect an infectious or inflammatory process.  Advised follow-up CT in 3 months to assess stability/ resolution.  -Patient has also changed her dermatologist due to insurance issues and is planning to see her in May.  # History of right arm melanoma in 2005 -Had resection at [place].  No records available.  # Hypertension-stable # Hyperlipidemia-stable # Diabetes-stable   Orders Placed This Encounter  Procedures   CT Chest W Contrast    Standing Status:   Future    Standing Expiration Date:   07/20/2023    Scheduling Instructions:     Schedule in 3 months    Order Specific Question:   If indicated for the ordered procedure, I authorize the administration of contrast media per Radiology protocol    Answer:   Yes    Order Specific Question:   Does the patient have a contrast media/X-ray dye allergy?    Answer:   No    Order Specific Question:   Preferred imaging location?    Answer:   [place]    The total time spent in the appointment was 25 minutes encounter with patients including review of chart and various tests results, discussions about plan of care and coordination of care plan   All questions were answered. The patient knows to call the clinic with any problems, questions or concerns. No barriers to learning was detected.  Jane Canary, MD 3/29/202412:39 PM  HISTORY OF PRESENTING ILLNESS:  Dawn Shepherd 72 y.o. female with pmh of diabetes, hypertension, hyperlipidemia was referred to medical oncology for monitoring of recurrent skin malignant melanoma as a transfer of care from [facility] due to change in insurance.  Interval history- I connected with the patient via a telephone visit. She has been feeling well overall.  Denies any concerns.   Has follow-up appointment with dermatology in May.  I have reviewed her chart and materials related to her cancer extensively and collaborated history with the patient. Summary of oncologic history is as follows: Oncology History Overview Note  Information obtained from [facility] where she had been treated so far.  History of right arm melanoma status post excision in 2005.  Then had left arm melanoma in August 2020.  Treated with 2 doses of neoadjuvant pembrolizumab status post excision in November 2020.   Malignant melanoma ([place])  11/2018 Pathology Results   11/2018 Subcutaneous recurrence on the left arm- Stage IV M1a A: Skin, Left upper arm, shave - Metastatic malignant melanoma Size: 1.5 cm Pattern: Dermal nodule with sheets of non-pigmented, pleomorphic epithelioid cells with clear cell change and focal necrosis (10%) Tumor infiltrating lymphocytes: Brisk Margins: Present in base of specimen  - Tumor negative for BRAF V600E mutation by VE1 immunostain    11/2018 PET scan   12/19/2018 PET - Small subcutaneous hypermetabolic nodule along the left upper arm, likely corresponding to known biopsy-proven melanoma.  - Small left anterolateral neck cutaneous hypermetabolic nodule, which may reflect the site of recently biopsied epidermal inclusion cyst, though additional malignant site is not excluded. Recommend correlation with clinical exam.--correlated   MRI brain negative for mets    02/01/2019 Imaging   The pt went to the ED w acute onset of lower back pain and while she was there a CT scan was done. The CT scan showed a few new/enlarging right lung nodules measuring up to 0.6 cm compared to 12/19/2018    03/05/2019 Surgery   03/05/19 Upper left arm, excision - Negative for residual melanoma. - Sparse dermal fibrosis, inflammation, hemosiderin. - Dermal nevus, margins free.   A: Lung, left lower lobe, wedge resection - Lung tissue with multifocal non necrotizing well formed granulomas.  See Note - Negative for malignancy  B: Lung, left lower lobe, wedge resection - Lung tissue with confluent non necrotizing well formed granulomas. See Note - Negative for malignancy  Note: AFB and fungal (GMS) stains are negative or organisms, No evidence of melanoma (SOX-10 stain reviewed)     Imaging       MEDICAL HISTORY:  Past Medical History:  Diagnosis Date   Diabetes mellitus without complication ([place])    High cholesterol    Hypertension    Skin cancer (melanoma) ([place]) 2005    SURGICAL HISTORY: Past Surgical History:  Procedure Laterality Date   APPENDECTOMY     BREAST CYST EXCISION Right    BREAST EXCISIONAL BIOPSY Left    benign   SKIN SURGERY     right wrist skin cancer.    TUBAL LIGATION      SOCIAL HISTORY: Social History   Socioeconomic History   Marital status: Widowed    Spouse name: Not on file   Number of children: Not on file   Years of education: Not on file   Highest education level: Not on file  Occupational History   Not on file  Tobacco Use   Smoking status: Every Day    Packs/day: 1.00  Years: 20.00    Additional pack years: 0.00    Total pack years: 20.00    Types: Cigarettes   Smokeless tobacco: Never  Vaping Use   Vaping Use: Never used  Substance and Sexual Activity   Alcohol use: No   Drug use: No   Sexual activity: Yes    Birth control/protection: None  Other Topics Concern   Not on file  Social History Narrative   Not on file   Social Determinants of Health   Financial Resource Strain: Not on file  Food Insecurity: Not on file  Transportation Needs: Not on file  Physical Activity: Not on file  Stress: Not on file  Social Connections: Not on file  Intimate Partner Violence: Not on file    FAMILY HISTORY: Family History  Problem Relation Age of Onset   Heart attack Mother 60   Heart attack Father 48   Breast cancer Neg Hx     ALLERGIES:  has No Known Allergies.  MEDICATIONS:  Current Outpatient  Medications  Medication Sig Dispense Refill   aspirin EC 81 MG tablet Take 81 mg by mouth daily.     atorvastatin (LIPITOR) 80 MG tablet Take 80 mg by mouth daily.  1   blood glucose meter kit and supplies Use as instructed to check blood sugar.     insulin lispro (HUMALOG KWIKPEN) 100 UNIT/ML KwikPen Inject into the skin.     lisinopril (ZESTRIL) 40 MG tablet Take 1 tablet (40 mg total) by mouth daily. 30 tablet 1   metFORMIN (GLUCOPHAGE) 1000 MG tablet Take 1,000 mg by mouth 2 (two) times daily.      Semaglutide, 1 MG/DOSE, (OZEMPIC, 1 MG/DOSE,) 4 MG/3ML SOPN INJECT SUBCUTANEOUSLY 1 MG EVERY WEEK     cephALEXin (KEFLEX) 500 MG capsule Take 1 capsule (500 mg total) by mouth 3 (three) times daily. (Patient not taking: Reported on 06/20/2022) 30 capsule 0   magnesium hydroxide (MILK OF MAGNESIA) 400 MG/5ML suspension Take 30 mLs by mouth daily. (Patient not taking: Reported on 06/20/2022) 355 mL 0   oxyCODONE-acetaminophen (PERCOCET/ROXICET) 5-325 MG tablet Take 1 tablet by mouth every 4 (four) hours as needed for moderate pain. (Patient not taking: Reported on 06/20/2022) 30 tablet 0   senna-docusate (SENOKOT-S) 8.6-50 MG tablet Take 1 tablet by mouth at bedtime as needed for mild constipation. (Patient not taking: Reported on 06/20/2022) 30 tablet 0   simethicone (MYLICON) 80 MG chewable tablet Chew 2 tablets (160 mg total) by mouth 4 (four) times daily as needed for flatulence. (Patient not taking: Reported on 06/20/2022) 30 tablet 0   No current facility-administered medications for this visit.    REVIEW OF SYSTEMS:   Pertinent information mentioned in HPI All other systems were reviewed with the patient and are negative.  PHYSICAL EXAMINATION: ECOG PERFORMANCE STATUS: 0 - Asymptomatic  There were no vitals filed for this visit.  There were no vitals filed for this visit.   GENERAL:alert, no distress and comfortable SKIN: skin color, texture, turgor are normal, no rashes or significant  lesions EYES: normal, conjunctiva are pink and non-injected, sclera clear OROPHARYNX:no exudate, no erythema and lips, buccal mucosa, and tongue normal  NECK: supple, thyroid normal size, non-tender, without nodularity LYMPH:  no palpable lymphadenopathy in the cervical, axillary or inguinal LUNGS: clear to auscultation and percussion with normal breathing effort HEART: regular rate & rhythm and no murmurs and no lower extremity edema ABDOMEN:abdomen soft, non-tender and normal bowel sounds Musculoskeletal:no cyanosis of digits and  no clubbing  PSYCH: alert & oriented x 3 with fluent speech NEURO: no focal motor/sensory deficits  LABORATORY DATA:  I have reviewed the data as listed Lab Results  Component Value Date   WBC 9.3 12/05/2021   HGB 13.6 12/05/2021   HCT 40.5 12/05/2021   MCV 88.2 12/05/2021   PLT 331 12/05/2021   Recent Labs    12/05/21 0909 07/13/22 0926  NA 137  --   K 4.1  --   CL 102  --   CO2 24  --   GLUCOSE 169*  --   BUN 16  --   CREATININE 1.04* 1.00  CALCIUM 10.2  --   GFRNONAA 58*  --     RADIOGRAPHIC STUDIES: I have personally reviewed the radiological images as listed and agreed with the findings in the report. CT CHEST ABDOMEN PELVIS W CONTRAST  Result Date: 07/13/2022 CLINICAL DATA:  History of melanoma, monitor.  * Tracking Code: BO * EXAM: CT CHEST, ABDOMEN, AND PELVIS WITH CONTRAST TECHNIQUE: Multidetector CT imaging of the chest, abdomen and pelvis was performed following the standard protocol during bolus administration of intravenous contrast. RADIATION DOSE REDUCTION: This exam was performed according to the departmental dose-optimization program which includes automated exposure control, adjustment of the mA and/or kV according to patient size and/or use of iterative reconstruction technique. CONTRAST:  173mL OMNIPAQUE IOHEXOL 300 MG/ML  SOLN COMPARISON:  Multiple priors including CT abdomen pelvis February 09, 2020 and chest CT April 09, 2011. FINDINGS: CT CHEST FINDINGS Cardiovascular: Aortic atherosclerosis. 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 Coronary artery calcifications. Normal size heart. No significant pericardial effusion/thickening. Mediastinum/Nodes: No suspicious thyroid nodule. No pathologically enlarged mediastinal, hilar or axillary lymph nodes. Mild diffuse esophageal wall thickening. Lungs/Pleura: Ill-defined diffuse subtle ground-glass nodularity with a peripheral predominance in the bilateral lungs for instance in the right upper lobe on image 51/3. Mild diffuse wall thickening. Linear band of atelectasis/scarring in the left lower lobe. No pleural effusion. Musculoskeletal: No aggressive lytic or blastic lesion of bone. Multilevel degenerative changes spine. Degenerative change of the bilateral shoulders. CT ABDOMEN PELVIS FINDINGS Hepatobiliary: No suspicious hepatic lesion. Cholelithiasis without findings of acute cholecystitis. No biliary ductal dilation. Pancreas: No pancreatic ductal dilation or evidence of acute inflammation. Spleen: No splenomegaly. Adrenals/Urinary Tract: Bilateral adrenal glands appear normal. No hydronephrosis. Kidneys demonstrate symmetric enhancement. Urinary bladder is unremarkable for degree of distension. Stomach/Bowel: No radiopaque enteric contrast material was administered. Stomach is nondistended limiting evaluation. No pathologic dilation of small or large bowel. Moderate volume of formed stool in the colon. No evidence of acute bowel inflammation. Vascular/Lymphatic: Aortic atherosclerosis. Smooth IVC contours. The portal, splenic and superior mesenteric veins are patent. No pathologically enlarged mediastinal, hilar or axillary lymph nodes. Reproductive: Uterus and bilateral adnexa are unremarkable. Other: No significant abdominopelvic free fluid. Musculoskeletal: No aggressive lytic or blastic lesion of bone. Right hemi transitional L5-S1 anatomy.  Multilevel degenerative changes spine. IMPRESSION: 1. No convincing evidence of metastatic disease within the chest, abdomen or pelvis. 2. Ill-defined diffuse subtle ground-glass nodularity with a peripheral predominance in the bilateral lungs, nonspecific but favored to reflect an infectious or inflammatory process. Suggest follow-up chest CT in 3 months to assess stability/resolution. 3. Mild diffuse esophageal wall thickening, suggestive of esophagitis. 4. Cholelithiasis without findings of acute cholecystitis. 5. Moderate volume of formed stool in the colon. Correlate for constipation. 6. 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 Recommend annual imaging followup by CTA  or MRA. This recommendation follows 2010 ACCF/AHA/AATS/ACR/ASA/SCA/SCAI/SIR/STS/SVM Guidelines for the Diagnosis and Management of Patients with Thoracic Aortic Disease. Circulation. 2010; [id number]. Aortic aneurysm NOS (ICD10-I71.9) 7. Aortic Atherosclerosis (ICD10-I70.0). Electronically Signed   By: Dahlia Bailiff M.D.   On: 07/13/2022 11:30

## 2022-10-20 ENCOUNTER — Ambulatory Visit
Admission: RE | Admit: 2022-10-20 | Discharge: 2022-10-20 | Disposition: A | Discharge status: Home / Self Care | Payer: 59 | Source: Ambulatory Visit | Attending: Internal Medicine | Admitting: Internal Medicine

## 2022-10-20 DIAGNOSIS — R9389 Abnormal findings on diagnostic imaging of other specified body structures: Secondary | ICD-10-CM | POA: Insufficient documentation

## 2022-10-20 MED ORDER — IOHEXOL 300 MG/ML  SOLN
75.0000 mL | Freq: Once | INTRAMUSCULAR | Status: AC | PRN
  Administered 2022-10-20: 75 mL via INTRAVENOUS

## 2022-10-23 ENCOUNTER — Telehealth: Payer: Self-pay

## 2022-10-23 ENCOUNTER — Inpatient Hospital Stay: Payer: 59 | Admitting: Internal Medicine

## 2022-10-23 NOTE — Telephone Encounter (Signed)
Tried to call patient for her scheduled phone office visit with Dr. Alena Bills this afternoon at 3:30 to see if any of her medications have changed or if she is having any other symptoms, unfortunately it went straight to her voicemail.

## 2022-10-27 ENCOUNTER — Inpatient Hospital Stay: Payer: 59 | Attending: Internal Medicine | Admitting: Internal Medicine

## 2022-10-27 ENCOUNTER — Other Ambulatory Visit: Payer: Self-pay | Admitting: *Deleted

## 2022-10-27 ENCOUNTER — Telehealth: Payer: Self-pay

## 2022-10-27 DIAGNOSIS — C4362 Malignant melanoma of left upper limb, including shoulder: Secondary | ICD-10-CM

## 2022-10-27 DIAGNOSIS — Z7984 Long term (current) use of oral hypoglycemic drugs: Secondary | ICD-10-CM | POA: Diagnosis not present

## 2022-10-27 DIAGNOSIS — Z794 Long term (current) use of insulin: Secondary | ICD-10-CM | POA: Insufficient documentation

## 2022-10-27 DIAGNOSIS — E785 Hyperlipidemia, unspecified: Secondary | ICD-10-CM | POA: Diagnosis not present

## 2022-10-27 DIAGNOSIS — Z79899 Other long term (current) drug therapy: Secondary | ICD-10-CM | POA: Insufficient documentation

## 2022-10-27 DIAGNOSIS — E119 Type 2 diabetes mellitus without complications: Secondary | ICD-10-CM | POA: Diagnosis not present

## 2022-10-27 DIAGNOSIS — Z85820 Personal history of malignant melanoma of skin: Secondary | ICD-10-CM | POA: Diagnosis present

## 2022-10-27 DIAGNOSIS — Z7982 Long term (current) use of aspirin: Secondary | ICD-10-CM | POA: Insufficient documentation

## 2022-10-27 DIAGNOSIS — I10 Essential (primary) hypertension: Secondary | ICD-10-CM | POA: Diagnosis not present

## 2022-10-27 NOTE — Progress Notes (Signed)
[facility] CONSULT NOTE  I connected with Dawn Shepherd on 10/27/22 at 11:30 AM EDT by my phone and verified that I am speaking with the correct person using two identifiers.   I discussed the limitations, risks, security and privacy concerns of performing an evaluation and management service by telemedicine and the availability of in-person appointments. I also discussed with the patient that there may be a patient responsible charge related to this service. The patient expressed understanding and agreed to proceed.   Other persons participating in the visit and their role in the encounter: None  Patient's location: Home Provider's location: Clinic  Chief Complaint: Discussed CT scan  Time spent in discussing about medical problems was 20 minutes  Patient Care Team: Marisue Ivan, MD as PCP - General (Family Medicine)  REFERRING PROVIDER: Dr. Cordelia Poche  REASON FOR REFFERAL: History of left arm malignant melanoma  CANCER STAGING   Cancer Staging  Malignant melanoma (HCC) Staging form: Melanoma of the Skin, AJCC 8th Edition - Clinical: Stage IV (pM1a) - Signed by Michaelyn Barter, MD on 06/20/2022   ASSESSMENT & PLAN:  Dawn Shepherd 72 y.o. female with pmh of diabetes, hypertension, hyperlipidemia was referred to medical oncology for monitoring of recurrent skin malignant melanoma as a transfer of care from [facility] due to change in insurance.  # Recurrent left arm melanoma -Status post shave biopsy in August 2020, size 1.5 cm.  BRAF V600 E mutation negative.  Followed by wide local excision with path negative for residual melanoma.  There was also concern for involvement of lung and she had left lower lobe wedge resection however pathology came back negative for malignancy.  Patient also received 2 cycles of pembrolizumab preoperatively.  -She has been on surveillance imaging every 6 months postsurgery.  Last scan was in August 2023 with NED.  - Repeat CT chest  from 10/20/2022 showed slight improvement in the bilateral ill-defined centrilobular groundglass micronodules with peripheral predominance.  Likely infection/inflammation.  No additional follow-up needed.  Will continue with surveillance CT chest abdomen pelvis every 6 months until November 2025 which will be 5 years from her diagnosis.  Her last CT abdomen/pelvis was in March 2024.  I will also add CT chest so that we can get her back on a 18-month schedule.  -Per patient, seen by [facility] dermatology.  Was have suspicious lesion in her thigh and is planned for resection next month.  # History of right arm melanoma in 2005 -Had resection at [place].  No records available.  # Hypertension-stable # Hyperlipidemia-stable # Diabetes-stable   No orders of the defined types were placed in this encounter.   The total time spent in the appointment was 25 minutes encounter with patients including review of chart and various tests results, discussions about plan of care and coordination of care plan   All questions were answered. The patient knows to call the clinic with any problems, questions or concerns. No barriers to learning was detected.  Michaelyn Barter, MD 7/5/20241:04 PM   HISTORY OF PRESENTING ILLNESS:  Dawn Shepherd 72 y.o. female with pmh of diabetes, hypertension, hyperlipidemia was referred to medical oncology for monitoring of recurrent skin malignant melanoma as a transfer of care from [facility] due to change in insurance.  Interval history- I connected with the patient via a telephone visit. She has been feeling well overall.  Denies any concerns.  Was seen by dermatology at [facility] and reports there was suspicious lesion and will have resection  done in August.  I have reviewed her chart and materials related to her cancer extensively and collaborated history with the patient. Summary of oncologic history is as follows: Oncology History Overview Note  Information obtained from [facility]  where she had been treated so far.  History of right arm melanoma status post excision in 2005.  Then had left arm melanoma in August 2020.  Treated with 2 doses of neoadjuvant pembrolizumab status post excision in November 2020.   Malignant melanoma (HCC)  11/2018 Pathology Results   11/2018 Subcutaneous recurrence on the left arm- Stage IV M1a A: Skin, Left upper arm, shave - Metastatic malignant melanoma Size: 1.5 cm Pattern: Dermal nodule with sheets of non-pigmented, pleomorphic epithelioid cells with clear cell change and focal necrosis (10%) Tumor infiltrating lymphocytes: Brisk Margins: Present in base of specimen  - Tumor negative for BRAF V600E mutation by VE1 immunostain    11/2018 PET scan   12/19/2018 PET - Small subcutaneous hypermetabolic nodule along the left upper arm, likely corresponding to known biopsy-proven melanoma.  - Small left anterolateral neck cutaneous hypermetabolic nodule, which may reflect the site of recently biopsied epidermal inclusion cyst, though additional malignant site is not excluded. Recommend correlation with clinical exam.--correlated   MRI brain negative for mets    02/01/2019 Imaging   The pt went to the ED w acute onset of lower back pain and while she was there a CT scan was done. The CT scan showed a few new/enlarging right lung nodules measuring up to 0.6 cm compared to 12/19/2018    03/05/2019 Surgery   03/05/19 Upper left arm, excision - Negative for residual melanoma. - Sparse dermal fibrosis, inflammation, hemosiderin. - Dermal nevus, margins free.   A: Lung, left lower lobe, wedge resection - Lung tissue with multifocal non necrotizing well formed granulomas. See Note - Negative for malignancy  B: Lung, left lower lobe, wedge resection - Lung tissue with confluent non necrotizing well formed granulomas. See Note - Negative for malignancy  Note: AFB and fungal (GMS) stains are negative or organisms, No evidence of melanoma  (SOX-10 stain reviewed)     Imaging       MEDICAL HISTORY:  Past Medical History:  Diagnosis Date   Diabetes mellitus without complication (HCC)    High cholesterol    Hypertension    Skin cancer (melanoma) (HCC) 2005    SURGICAL HISTORY: Past Surgical History:  Procedure Laterality Date   APPENDECTOMY     BREAST CYST EXCISION Right    BREAST EXCISIONAL BIOPSY Left    benign   SKIN SURGERY     right wrist skin cancer.    TUBAL LIGATION      SOCIAL HISTORY: Social History   Socioeconomic History   Marital status: Widowed    Spouse name: Not on file   Number of children: Not on file   Years of education: Not on file   Highest education level: Not on file  Occupational History   Not on file  Tobacco Use   Smoking status: Every Day    Packs/day: 1.00    Years: 20.00    Additional pack years: 0.00    Total pack years: 20.00    Types: Cigarettes   Smokeless tobacco: Never  Vaping Use   Vaping Use: Never used  Substance and Sexual Activity   Alcohol use: No   Drug use: No   Sexual activity: Yes    Birth control/protection: None  Other Topics Concern   Not  on file  Social History Narrative   Not on file   Social Determinants of Health   Financial Resource Strain: Not on file  Food Insecurity: Not on file  Transportation Needs: Not on file  Physical Activity: Not on file  Stress: Not on file  Social Connections: Not on file  Intimate Partner Violence: Not on file    FAMILY HISTORY: Family History  Problem Relation Age of Onset   Heart attack Mother 67   Heart attack Father 25   Breast cancer Neg Hx     ALLERGIES:  has No Known Allergies.  MEDICATIONS:  Current Outpatient Medications  Medication Sig Dispense Refill   aspirin EC 81 MG tablet Take 81 mg by mouth daily.     atorvastatin (LIPITOR) 80 MG tablet Take 80 mg by mouth daily.  1   blood glucose meter kit and supplies Use as instructed to check blood sugar.     cephALEXin (KEFLEX) 500  MG capsule Take 1 capsule (500 mg total) by mouth 3 (three) times daily. (Patient not taking: Reported on 06/20/2022) 30 capsule 0   insulin lispro (HUMALOG KWIKPEN) 100 UNIT/ML KwikPen Inject into the skin.     lisinopril (ZESTRIL) 40 MG tablet Take 1 tablet (40 mg total) by mouth daily. 30 tablet 1   magnesium hydroxide (MILK OF MAGNESIA) 400 MG/5ML suspension Take 30 mLs by mouth daily. (Patient not taking: Reported on 06/20/2022) 355 mL 0   metFORMIN (GLUCOPHAGE) 1000 MG tablet Take 1,000 mg by mouth 2 (two) times daily.      oxyCODONE-acetaminophen (PERCOCET/ROXICET) 5-325 MG tablet Take 1 tablet by mouth every 4 (four) hours as needed for moderate pain. (Patient not taking: Reported on 06/20/2022) 30 tablet 0   Semaglutide, 1 MG/DOSE, (OZEMPIC, 1 MG/DOSE,) 4 MG/3ML SOPN INJECT SUBCUTANEOUSLY 1 MG EVERY WEEK     senna-docusate (SENOKOT-S) 8.6-50 MG tablet Take 1 tablet by mouth at bedtime as needed for mild constipation. (Patient not taking: Reported on 06/20/2022) 30 tablet 0   simethicone (MYLICON) 80 MG chewable tablet Chew 2 tablets (160 mg total) by mouth 4 (four) times daily as needed for flatulence. (Patient not taking: Reported on 06/20/2022) 30 tablet 0   No current facility-administered medications for this visit.    REVIEW OF SYSTEMS:   Pertinent information mentioned in HPI All other systems were reviewed with the patient and are negative.  PHYSICAL EXAMINATION: ECOG PERFORMANCE STATUS: 0 - Asymptomatic  There were no vitals filed for this visit.  There were no vitals filed for this visit.   GENERAL:alert, no distress and comfortable SKIN: skin color, texture, turgor are normal, no rashes or significant lesions EYES: normal, conjunctiva are pink and non-injected, sclera clear OROPHARYNX:no exudate, no erythema and lips, buccal mucosa, and tongue normal  NECK: supple, thyroid normal size, non-tender, without nodularity LYMPH:  no palpable lymphadenopathy in the cervical,  axillary or inguinal LUNGS: clear to auscultation and percussion with normal breathing effort HEART: regular rate & rhythm and no murmurs and no lower extremity edema ABDOMEN:abdomen soft, non-tender and normal bowel sounds Musculoskeletal:no cyanosis of digits and no clubbing  PSYCH: alert & oriented x 3 with fluent speech NEURO: no focal motor/sensory deficits  LABORATORY DATA:  I have reviewed the data as listed Lab Results  Component Value Date   WBC 9.3 12/05/2021   HGB 13.6 12/05/2021   HCT 40.5 12/05/2021   MCV 88.2 12/05/2021   PLT 331 12/05/2021   Recent Labs    12/05/21 0909  07/13/22 0926  NA 137  --   K 4.1  --   CL 102  --   CO2 24  --   GLUCOSE 169*  --   BUN 16  --   CREATININE 1.04* 1.00  CALCIUM 10.2  --   GFRNONAA 58*  --     RADIOGRAPHIC STUDIES: I have personally reviewed the radiological images as listed and agreed with the findings in the report. CT Chest W Contrast  Result Date: 10/26/2022 CLINICAL DATA:  Follow-up ill-defined ground-glass density seen on CT 07/13/2022 EXAM: CT CHEST WITH CONTRAST TECHNIQUE: Multidetector CT imaging of the chest was performed during intravenous contrast administration. RADIATION DOSE REDUCTION: This exam was performed according to the departmental dose-optimization program which includes automated exposure control, adjustment of the mA and/or kV according to patient size and/or use of iterative reconstruction technique. CONTRAST:  75mL OMNIPAQUE IOHEXOL 300 MG/ML  SOLN COMPARISON:  CT 07/13/2022 FINDINGS: Cardiovascular: Coronary artery and aortic atherosclerotic calcification. No pericardial effusion. The ascending thoracic aorta is dilated measuring 41 mm in maximum diameter (series 5/image 49). Mediastinum/Nodes: No new or enlarging thoracic lymph nodes. Unremarkable trachea and esophagus. Lungs/Pleura: Slight improvement in the bilateral ill-defined centrilobular ground-glass micro nodules with a peripheral predominance  (for example in the right upper lobe on series 4/image 34). These almost certainly represent small airway infection/inflammation. No additional follow-up is recommended. Scarring and atelectasis in the left lower lobe is stable. No focal consolidation, pleural effusion, or pneumothorax. Upper Abdomen: Cholelithiasis. Hepatic steatosis. No acute abnormality. Musculoskeletal: No acute fracture. IMPRESSION: Slight improvement in the bilateral ill-defined centrilobular ground-glass micro nodules with a peripheral predominance. These almost certainly represent small airway infection/inflammation. No additional follow-up is recommended. Aortic Atherosclerosis (ICD10-I70.0). Electronically Signed   By: Minerva Fester M.D.   On: 10/26/2022 21:51

## 2022-10-27 NOTE — Telephone Encounter (Signed)
Tried to call the patient for her telephone visit that is scheduled for today at 11:30. No answer and no way to leave a message to remind the patient.

## 2022-11-06 ENCOUNTER — Other Ambulatory Visit: Payer: Self-pay | Admitting: Family Medicine

## 2022-11-06 DIAGNOSIS — Z1231 Encounter for screening mammogram for malignant neoplasm of breast: Secondary | ICD-10-CM

## 2022-11-08 ENCOUNTER — Ambulatory Visit
Admission: RE | Admit: 2022-11-08 | Discharge: 2022-11-08 | Disposition: A | Discharge status: Home / Self Care | Payer: 59 | Source: Ambulatory Visit | Attending: Family Medicine | Admitting: Family Medicine

## 2022-11-08 DIAGNOSIS — Z1231 Encounter for screening mammogram for malignant neoplasm of breast: Secondary | ICD-10-CM | POA: Diagnosis present

## 2023-01-16 ENCOUNTER — Encounter: Payer: Self-pay | Admitting: *Deleted

## 2023-01-17 ENCOUNTER — Ambulatory Visit: Admission: RE | Admit: 2023-01-17 | Payer: 59 | Source: Ambulatory Visit

## 2023-01-26 ENCOUNTER — Inpatient Hospital Stay: Payer: 59 | Attending: Internal Medicine | Admitting: Internal Medicine

## 2023-05-09 NOTE — Progress Notes (Signed)
 Assessment and Plan:   Dawn Shepherd was seen today for follow-up.  Diagnoses and all orders for this visit:  Essential hypertension Mixed hyperlipidemia  -Patient adherent with 80 mg atorvastatin , lisinopril -hydrochlorothiazide (20-25) -LDL 129, Cr 0.98, K 4.1 -Diet advice, exercise  Type 2 diabetes mellitus with complication, without long-term current use of insulin  (CMS-HCC)        -     Pt will decrease metformin from 1000 mg BID to 500 mg BID, continue 0.5 mg Ozempic, restart long acting insulin , diet advice/activity       -     Has taken Ozempic, glimepiride for many years without adverse effects       -     Plans to monitor GI side effects on lower dose of metformin & will update provider       -     A1c: 8.1--7.1 -     POCT glycosylated hemoglobin (Hb A1C)  Neck pain        -   Likely muscle strain--gentle activity, stretches, heat, analgesia  Metastatic melanoma (CMS-HCC)        -  Seen by medical oncology 03/05/23--NED, needs one more follow up in 2025, then can be seen PRN  Barriers to recommended plan: None identified  Return for 3-4 months .   Subjective:   HPI: Dawn Shepherd is a 73 y.o. female here for Follow-up.  Type 2 DM: Episodes of nausea, throwing up after meals, occurring for past few months.  Usually each morning, patient has episodes where she feels sick to her stomach , throws up and feels better. Paused metformin to see if causing symptoms,but saw blood sugars increasing, re-started 1000 mg metformin BID. Taking 0.5 mg Ozempic weekly for 2 years, 2 mg glimepiride--no changes in diabetic medications for many years. Fastings: 160.  Patient thinks lowest blood sugar in 80s.  Not taking long-acting insulin , only taking rapid acting insulin  when needed. Patient eating low-carb, lean proteins including nuts, nut spread. Hard time remaining active due to weather.  HTN, high cholesterol: Patient adherent with 80 mg atorvastatin ,  lisinopril -hydrochlorothiazide (20-25) LDL 129, Cr 0.98, K 4.1   R sided neck pain: thinks there is lump present, 'uncomfortable' in R lateral neck Present for past few months, pain and soreness worse with lateral movement. No pins, needles, weakness in bilateral upper extremities Was treated for scalp abscess 03/20/23  Metastatic melanoma: Seen by medical oncology 03/05/23--NED, needs one more follow up in 2025, then can be seen PRN  I have reviewed past medical, surgical, medications, allergies, social and family histories today and updated them in Epic where appropriate.  ROS:  Review of Systems  Respiratory: Negative.    Cardiovascular: Negative.   Gastrointestinal:  Positive for nausea and vomiting.  Musculoskeletal:  Positive for neck pain and neck stiffness.  Skin: Negative.   All other systems reviewed and are negative.    Review of systems negative unless otherwise noted as per HPI.    Objective:   Vitals:   05/09/23 0926  BP: 120/70  Pulse: 83  Temp: 36.3 C (97.4 F)  SpO2: 98%   Body mass index is 26.41 kg/m.  Physical Exam Vitals and nursing note reviewed.  Constitutional:      Appearance: Normal appearance.  HENT:     Head: Normocephalic and atraumatic.  Cardiovascular:     Rate and Rhythm: Normal rate and regular rhythm.     Pulses: Normal pulses.     Heart sounds: Normal  heart sounds.  Pulmonary:     Effort: Pulmonary effort is normal. No respiratory distress.     Breath sounds: Normal breath sounds. No stridor. No wheezing, rhonchi or rales.  Chest:     Chest wall: No tenderness.  Musculoskeletal:     Comments: FROM in neck, no c-spine bony tenderness/deviations  Soft tissue tenderness in R lateral aspect of neck Pain reproducible with lateral movement  Full range of movement in bilateral shoulders, bilateral upper extremities.  Skin:    General: Skin is warm.     Capillary Refill: Capillary refill takes less than 2 seconds.   Neurological:     General: No focal deficit present.     Mental Status: She is alert and oriented to person, place, and time. Mental status is at baseline.  Psychiatric:        Mood and Affect: Mood normal.        Behavior: Behavior normal.        Thought Content: Thought content normal.        Judgment: Judgment normal.       Medication adherence and barriers to the treatment plan have been addressed. Opportunities to optimize healthy behaviors have been discussed. Patient / caregiver voiced understanding.  I personally spent 30 minutes face-to-face and non-face-to-face in the care of this patient, which includes all pre, intra, and post visit time on the date of service. Alfonso Grow, DNP, FNP-C [facility] [phone] (F)  Note - This record has been created using [employer]. Chart creation errors have been sought, but may not always have been located. Such creation errors do not reflect on the standard of medical care.

## 2023-06-07 IMAGING — CR DG CHEST 2V
1 series · 2 of 2 positions shown · non-contrast
Comparison: 11/08/2017

CLINICAL DATA: Chest and RIGHT arm pain, midsternal chest pain
since yesterday, RIGHT arm pain and numbness now, some shortness of
breath and chest heaviness, history hypertension

EXAM:
CHEST - 2 VIEW

[Series 1: dg chest 2 view · 0.14mm/px · 2 of 2 slices shown]
[im 1/2]
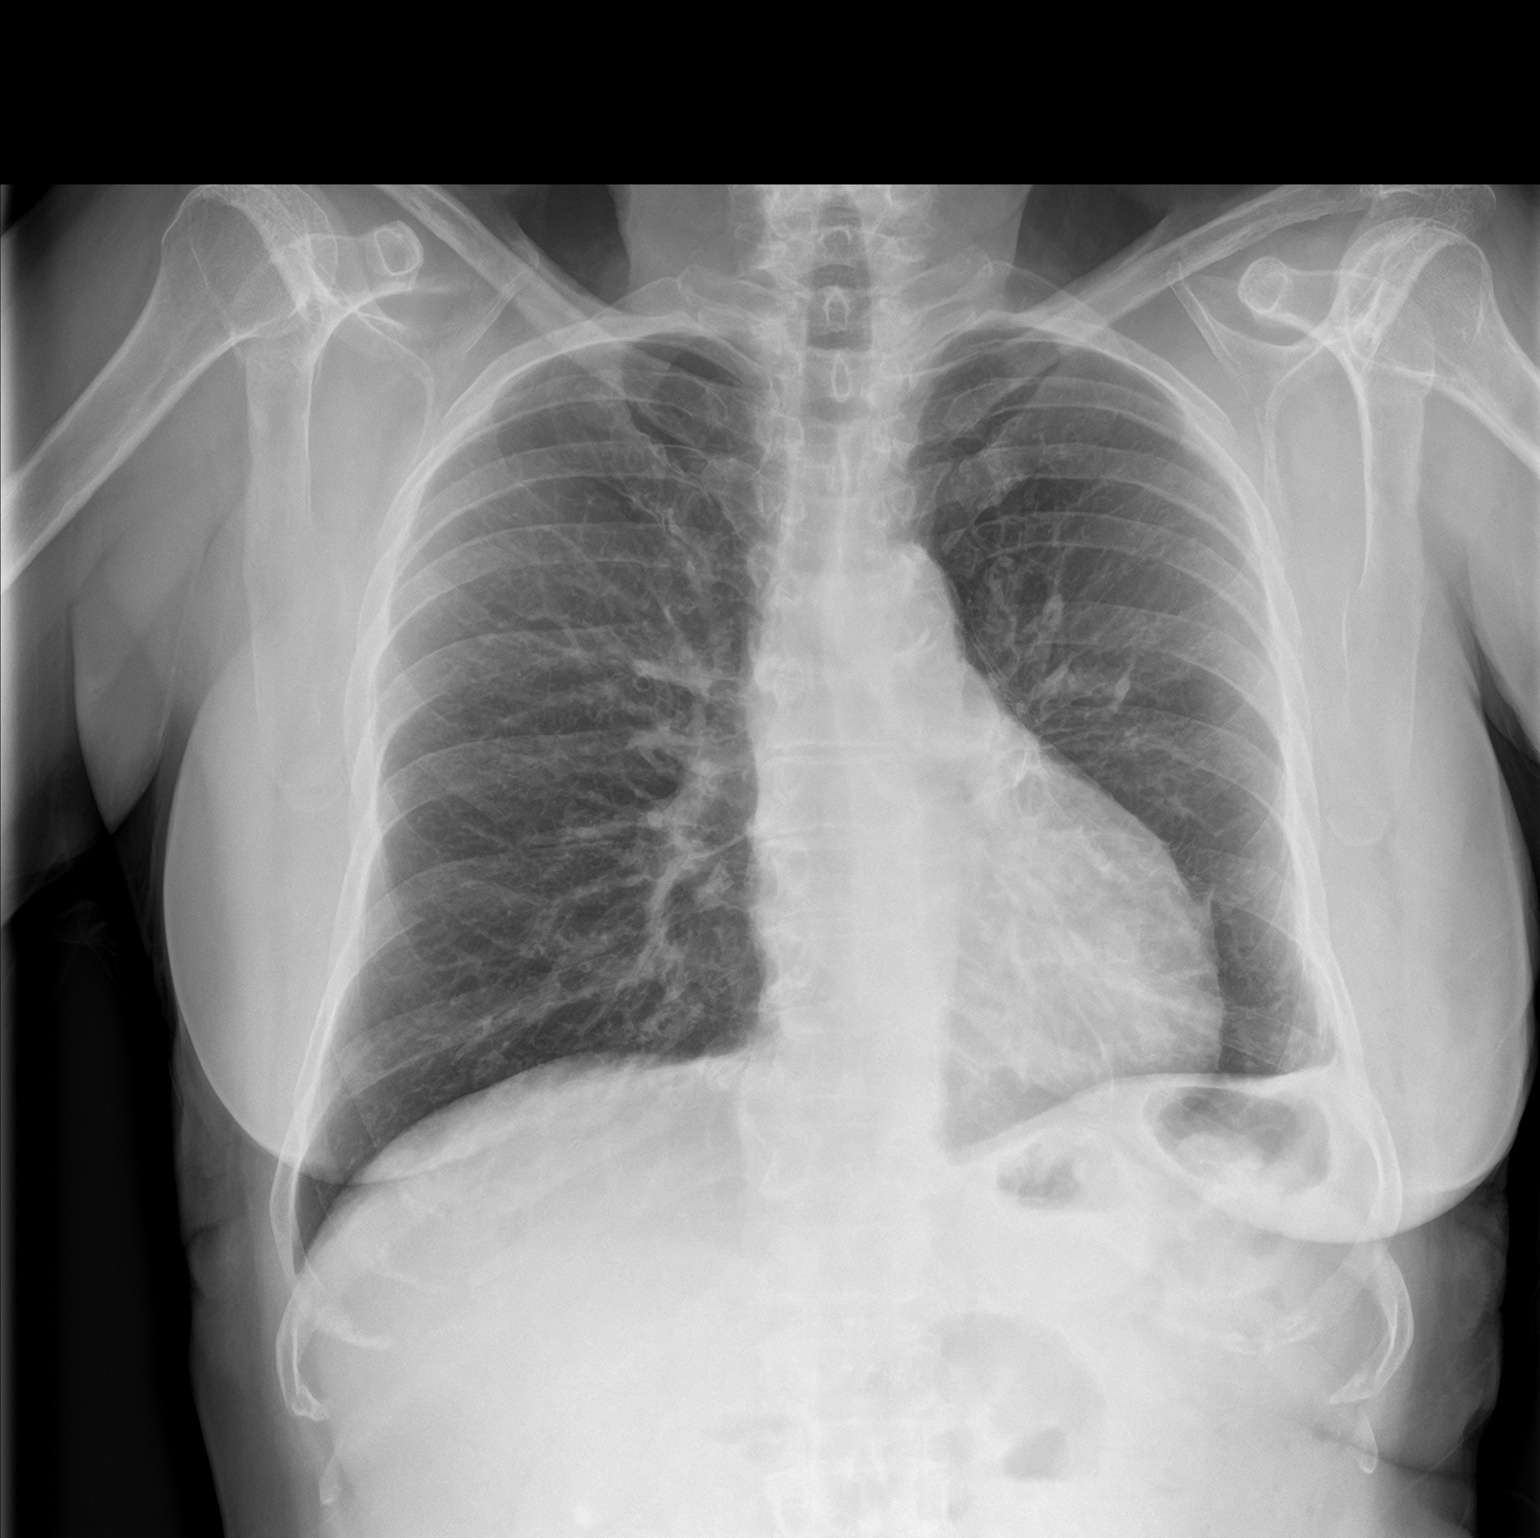
[im 2/2]
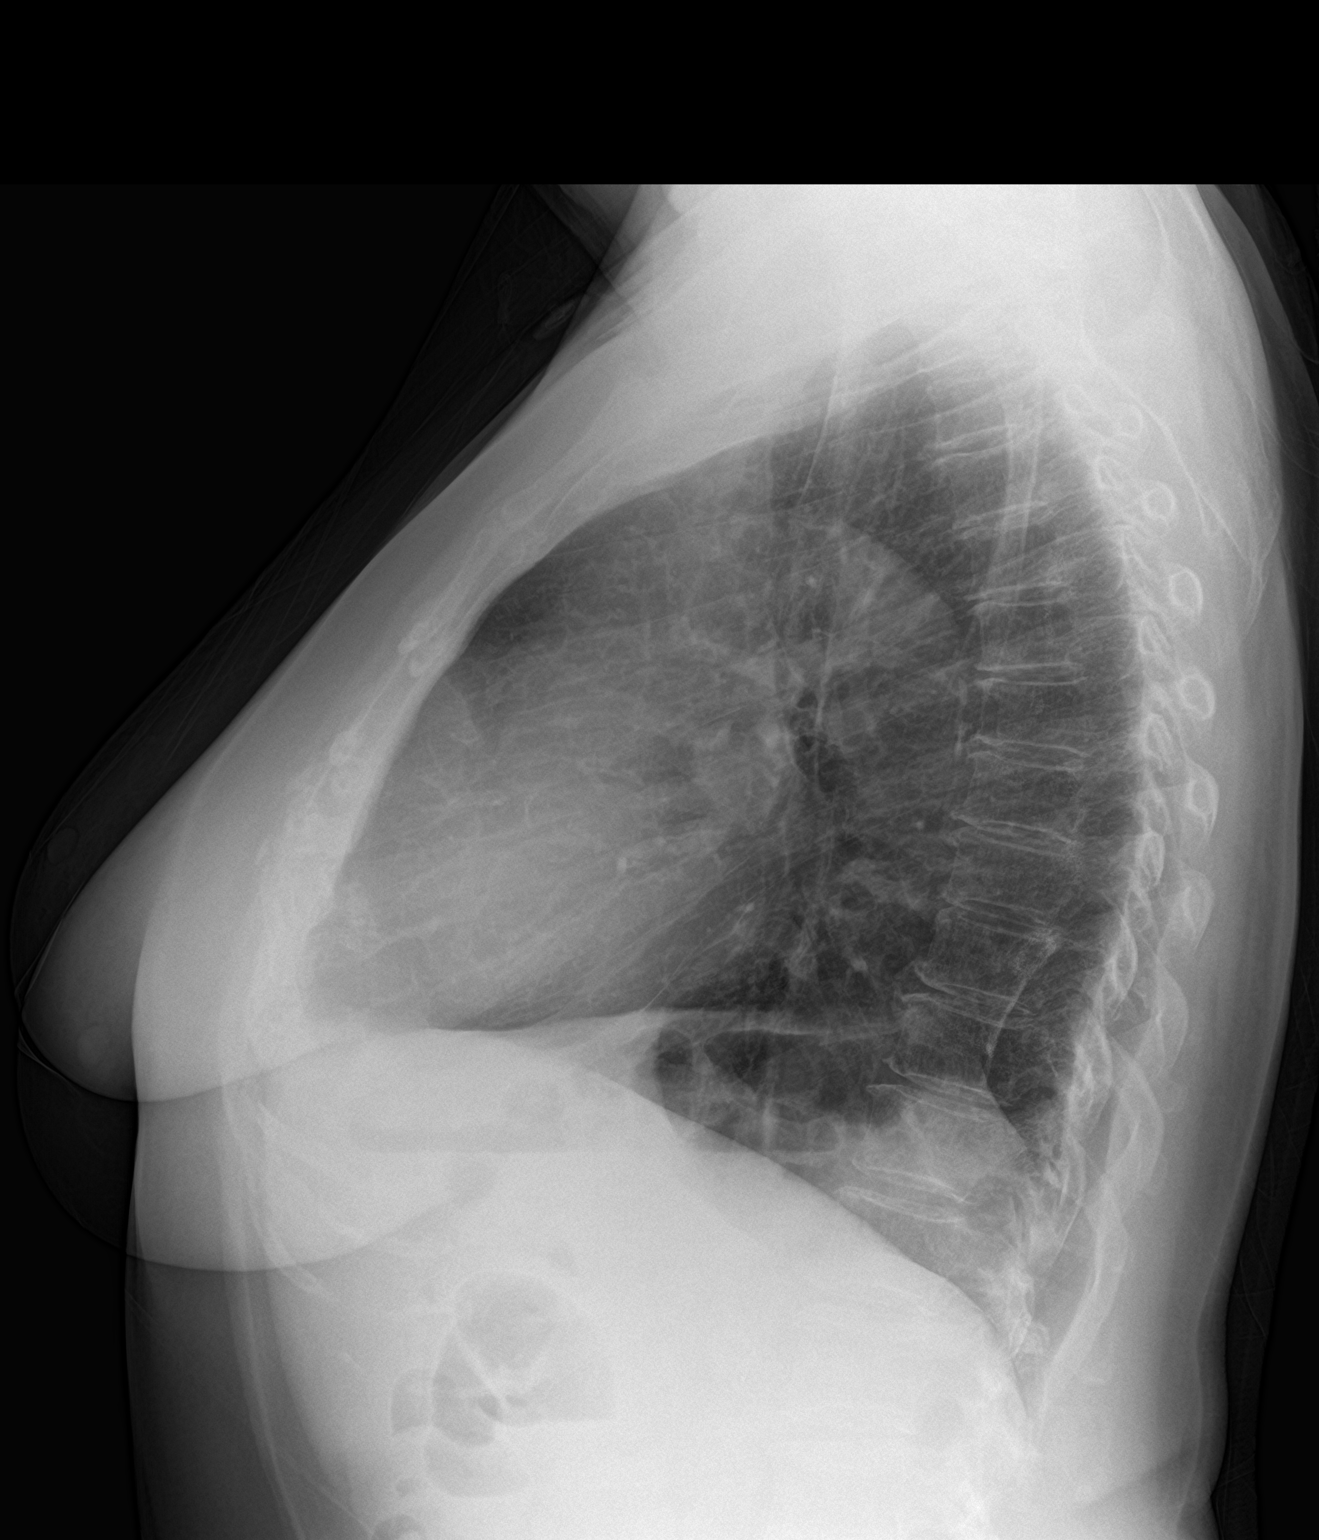

[2 of 2 positions shown; findings below may reference images not displayed]

FINDINGS: Normal heart size, mediastinal contours, and pulmonary vascularity.

Atherosclerotic calcification aorta.

Minimal LEFT basilar scarring.

Lungs otherwise clear.

No pulmonary infiltrate, pleural effusion, or pneumothorax.

Osseous structures demineralized.
IMPRESSION: Minimal LEFT basilar scarring.

No acute abnormalities.

## 2023-10-09 ENCOUNTER — Other Ambulatory Visit: Payer: Self-pay | Admitting: Family Medicine

## 2023-10-09 DIAGNOSIS — Z1231 Encounter for screening mammogram for malignant neoplasm of breast: Secondary | ICD-10-CM

## 2023-11-12 ENCOUNTER — Ambulatory Visit
Admission: RE | Admit: 2023-11-12 | Discharge: 2023-11-12 | Disposition: A | Discharge status: Home / Self Care | Source: Ambulatory Visit | Attending: Family Medicine | Admitting: Family Medicine

## 2023-11-12 DIAGNOSIS — Z1231 Encounter for screening mammogram for malignant neoplasm of breast: Secondary | ICD-10-CM | POA: Insufficient documentation

## 2023-11-14 ENCOUNTER — Encounter: Payer: Self-pay | Admitting: Family Medicine

## 2023-11-15 ENCOUNTER — Other Ambulatory Visit: Payer: Self-pay | Admitting: Family Medicine

## 2023-11-15 DIAGNOSIS — R928 Other abnormal and inconclusive findings on diagnostic imaging of breast: Secondary | ICD-10-CM

## 2023-11-20 ENCOUNTER — Ambulatory Visit
Admission: RE | Admit: 2023-11-20 | Discharge: 2023-11-20 | Disposition: A | Discharge status: Home / Self Care | Source: Ambulatory Visit | Attending: Family Medicine | Admitting: Family Medicine

## 2023-11-20 ENCOUNTER — Other Ambulatory Visit: Payer: Self-pay | Admitting: Family Medicine

## 2023-11-20 DIAGNOSIS — R928 Other abnormal and inconclusive findings on diagnostic imaging of breast: Secondary | ICD-10-CM

## 2023-11-22 ENCOUNTER — Ambulatory Visit
Admission: RE | Admit: 2023-11-22 | Discharge: 2023-11-22 | Disposition: A | Discharge status: Home / Self Care | Source: Ambulatory Visit | Attending: Family Medicine | Admitting: Family Medicine

## 2023-11-22 ENCOUNTER — Inpatient Hospital Stay
Admission: RE | Admit: 2023-11-22 | Discharge: 2023-11-22 | Discharge status: Home / Self Care | Source: Ambulatory Visit | Attending: Family Medicine | Admitting: Family Medicine

## 2023-11-22 DIAGNOSIS — R928 Other abnormal and inconclusive findings on diagnostic imaging of breast: Secondary | ICD-10-CM | POA: Insufficient documentation

## 2023-11-22 DIAGNOSIS — C50411 Malignant neoplasm of upper-outer quadrant of right female breast: Secondary | ICD-10-CM | POA: Diagnosis present

## 2023-11-22 HISTORY — PX: BREAST BIOPSY: SHX20

## 2023-11-22 MED ORDER — LIDOCAINE 1 % OPTIME INJ - NO CHARGE
2.0000 mL | Freq: Once | INTRAMUSCULAR | Status: AC
  Administered 2023-11-22: 2 mL
  Filled 2023-11-22: qty 2

## 2023-11-22 MED ORDER — LIDOCAINE-EPINEPHRINE 1 %-1:100000 IJ SOLN
8.0000 mL | Freq: Once | INTRAMUSCULAR | Status: AC
  Administered 2023-11-22: 8 mL
  Filled 2023-11-22: qty 8

## 2023-11-23 LAB — SURGICAL PATHOLOGY

## 2023-11-26 ENCOUNTER — Encounter: Payer: Self-pay | Admitting: *Deleted

## 2023-11-26 NOTE — Progress Notes (Signed)
 Received referral for newly diagnosed breast cancer from [facility].  Navigation initiated.  Dawn Shepherd wants to be seen at [facility], she has an appointment scheduled for 8/6.  Nothing further needed at this time.

## 2023-12-10 IMAGING — MG MM DIGITAL SCREENING BILAT W/ TOMO AND CAD
8 series · 8 of 24 positions shown · non-contrast
Comparison: Previous exam(s).

CLINICAL DATA: Screening.

EXAM:
DIGITAL SCREENING BILATERAL MAMMOGRAM WITH TOMOSYNTHESIS AND CAD
TECHNIQUE: Bilateral screening digital craniocaudal and mediolateral oblique
mammograms were obtained. Bilateral screening digital breast
tomosynthesis was performed. The images were evaluated with
computer-aided detection.

[R CC synth-2D]
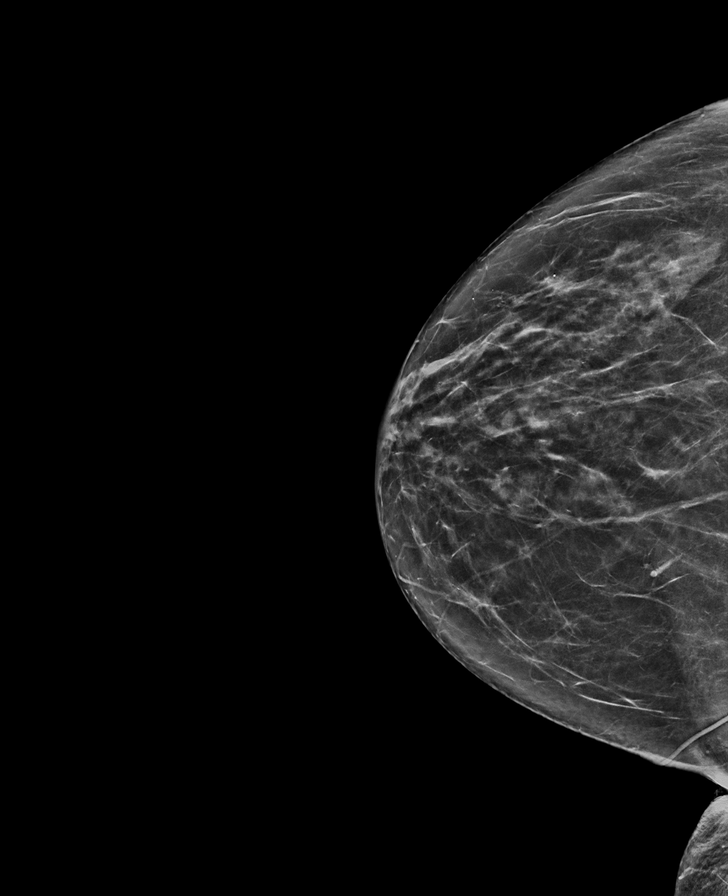

[L MLO synth-2D]
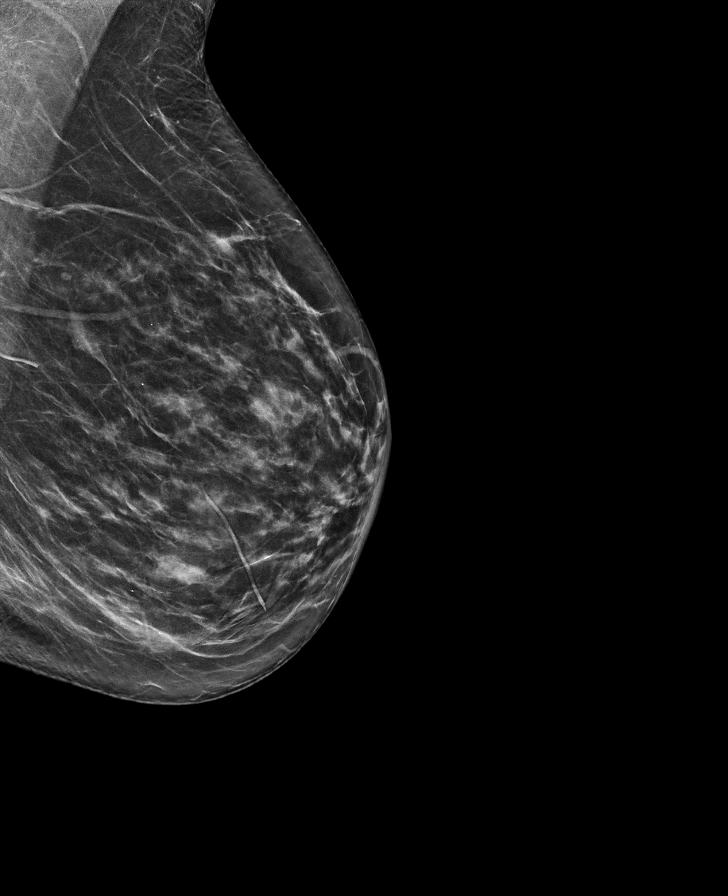

[R MLO synth-2D]
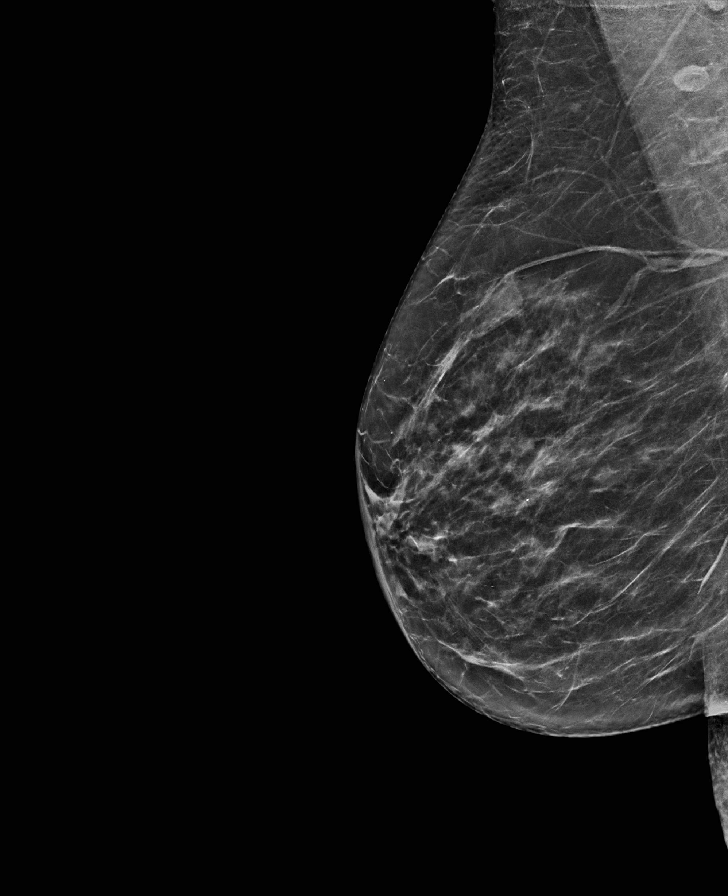

[L CC synth-2D]
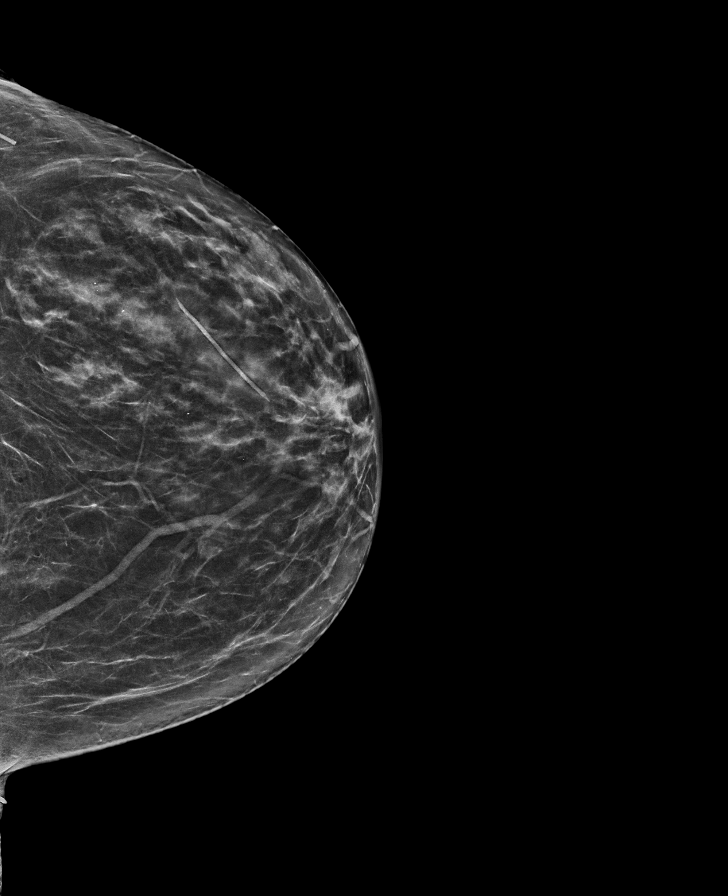

[R MLO tomo · tomo slice 33/66.0]
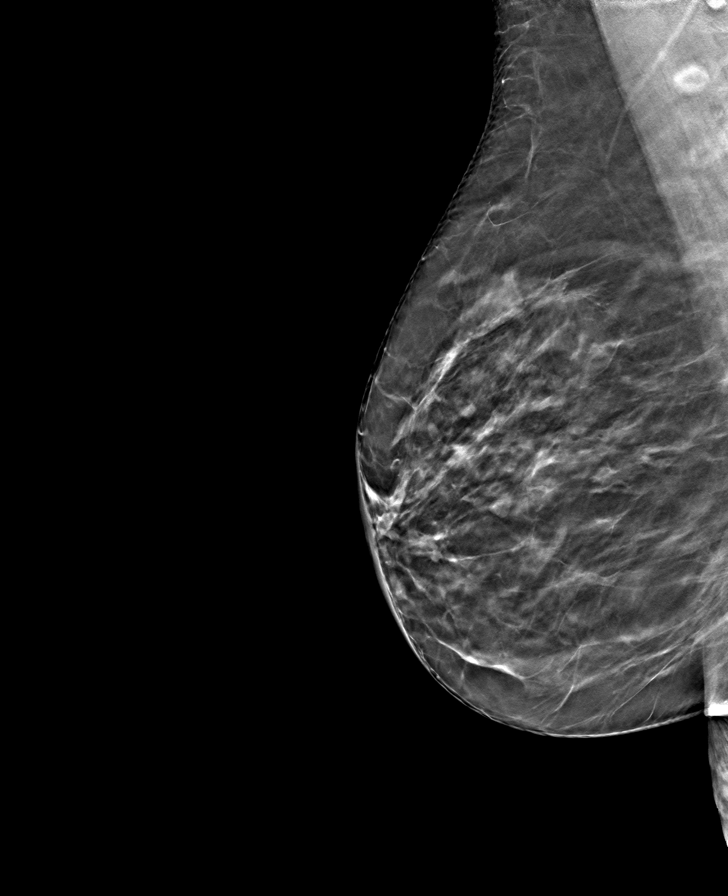

[R CC tomo · tomo slice 34/67.0]
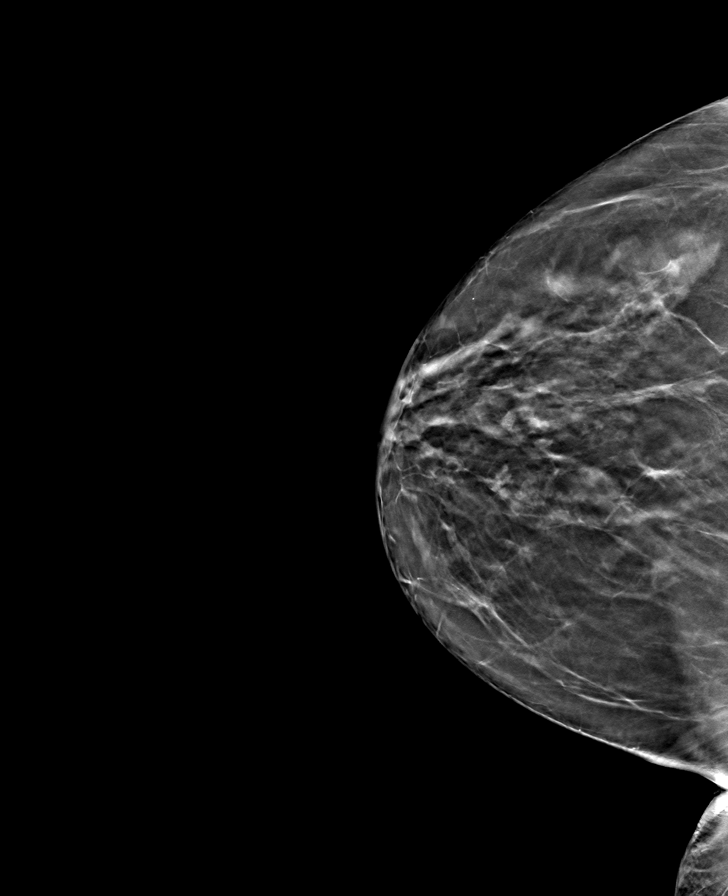

[L CC tomo · tomo slice 35/68.0]
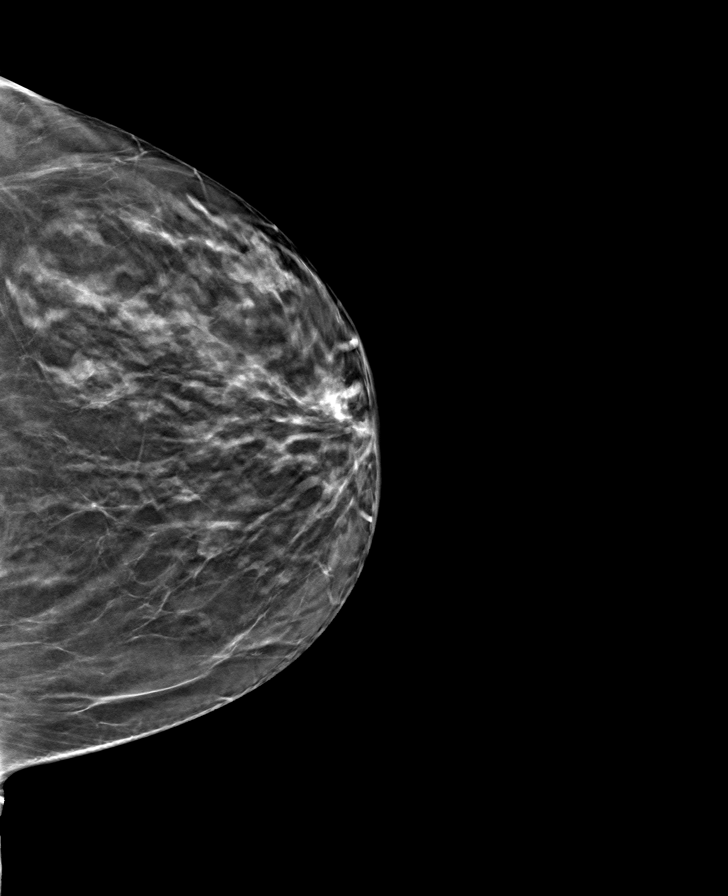

[L MLO tomo · tomo slice 35/69.0]
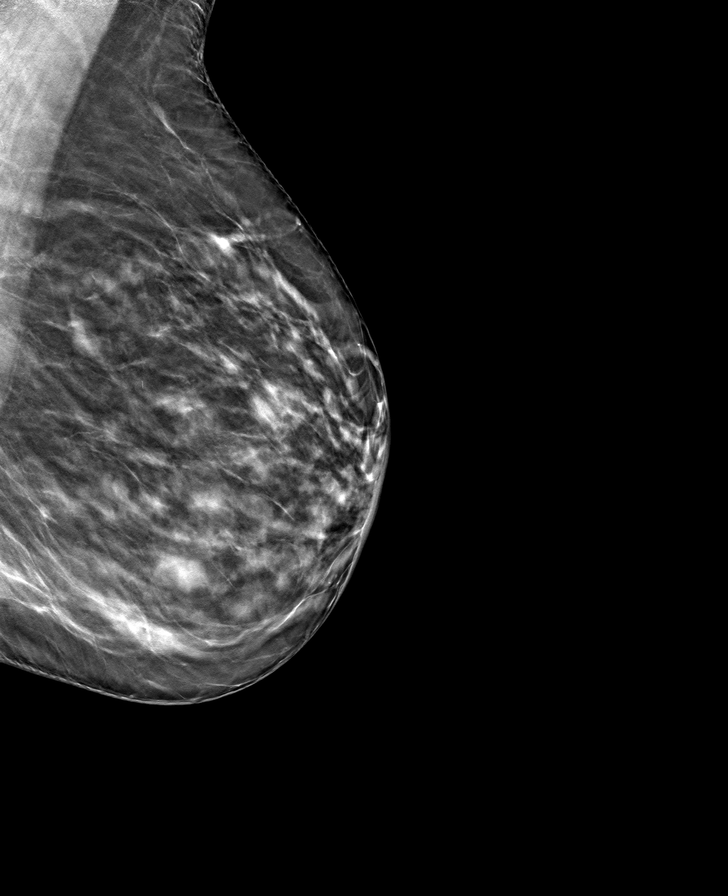

[8 of 24 positions shown; findings below may reference images not displayed]

ACR Breast Density Category c: The breast tissue is heterogeneously
dense, which may obscure small masses.
FINDINGS: There are no findings suspicious for malignancy.
IMPRESSION: No mammographic evidence of malignancy. A result letter of this
screening mammogram will be mailed directly to the patient.

RECOMMENDATION:
Screening mammogram in one year. (Code:Q3-W-BC3)

BI-RADS CATEGORY  1: Negative.
